# Patient Record
Sex: Male | Born: 1940
Health system: Southern US, Community
[De-identification: ages and names within clinical notes are randomized; demographics above are authoritative.]

## PROBLEM LIST (undated history)

## (undated) DIAGNOSIS — C61 Malignant neoplasm of prostate: Secondary | ICD-10-CM

## (undated) DIAGNOSIS — I10 Essential (primary) hypertension: Secondary | ICD-10-CM

## (undated) DIAGNOSIS — E119 Type 2 diabetes mellitus without complications: Secondary | ICD-10-CM

## (undated) DIAGNOSIS — M1990 Unspecified osteoarthritis, unspecified site: Secondary | ICD-10-CM

## (undated) DIAGNOSIS — K635 Polyp of colon: Secondary | ICD-10-CM

## (undated) HISTORY — DX: Essential (primary) hypertension: I10

## (undated) HISTORY — DX: Unspecified osteoarthritis, unspecified site: M19.90

## (undated) HISTORY — PX: TONSILLECTOMY: SUR1361

## (undated) HISTORY — DX: Polyp of colon: K63.5

## (undated) HISTORY — DX: Malignant neoplasm of prostate: C61

## (undated) HISTORY — DX: Type 2 diabetes mellitus without complications: E11.9

## (undated) HISTORY — PX: OTHER SURGICAL HISTORY: SHX169

---

## 2001-09-17 DIAGNOSIS — C61 Malignant neoplasm of prostate: Secondary | ICD-10-CM

## 2001-09-17 HISTORY — DX: Malignant neoplasm of prostate: C61

## 2004-01-18 ENCOUNTER — Ambulatory Visit: Admission: RE | Admit: 2004-01-18 | Discharge: 2004-04-17 | Payer: Self-pay | Admitting: Radiation Oncology

## 2004-01-31 ENCOUNTER — Encounter: Admission: RE | Admit: 2004-01-31 | Discharge: 2004-01-31 | Payer: Self-pay | Admitting: Urology

## 2004-03-06 ENCOUNTER — Ambulatory Visit (HOSPITAL_BASED_OUTPATIENT_CLINIC_OR_DEPARTMENT_OTHER): Admission: RE | Admit: 2004-03-06 | Discharge: 2004-03-06 | Payer: Self-pay | Admitting: Urology

## 2004-03-06 ENCOUNTER — Ambulatory Visit (HOSPITAL_COMMUNITY): Admission: RE | Admit: 2004-03-06 | Discharge: 2004-03-06 | Payer: Self-pay | Admitting: Urology

## 2005-06-19 IMAGING — CR DG CHEST 2V
2 series · 2 of 2 positions shown · non-contrast
Comparison: none

CLINICAL DATA: Preoperative respiratory exam.  V72.82.  Carcinoma of the prostate gland.
 PA AND LATERAL CHEST ? 01/31/2004
 The heart size and mediastinal contours are normal. The lungs are clear. The visualized skeleton is unremarkable.

 IMPRESSION
 No active disease.

[view not recorded (1 of 2)]
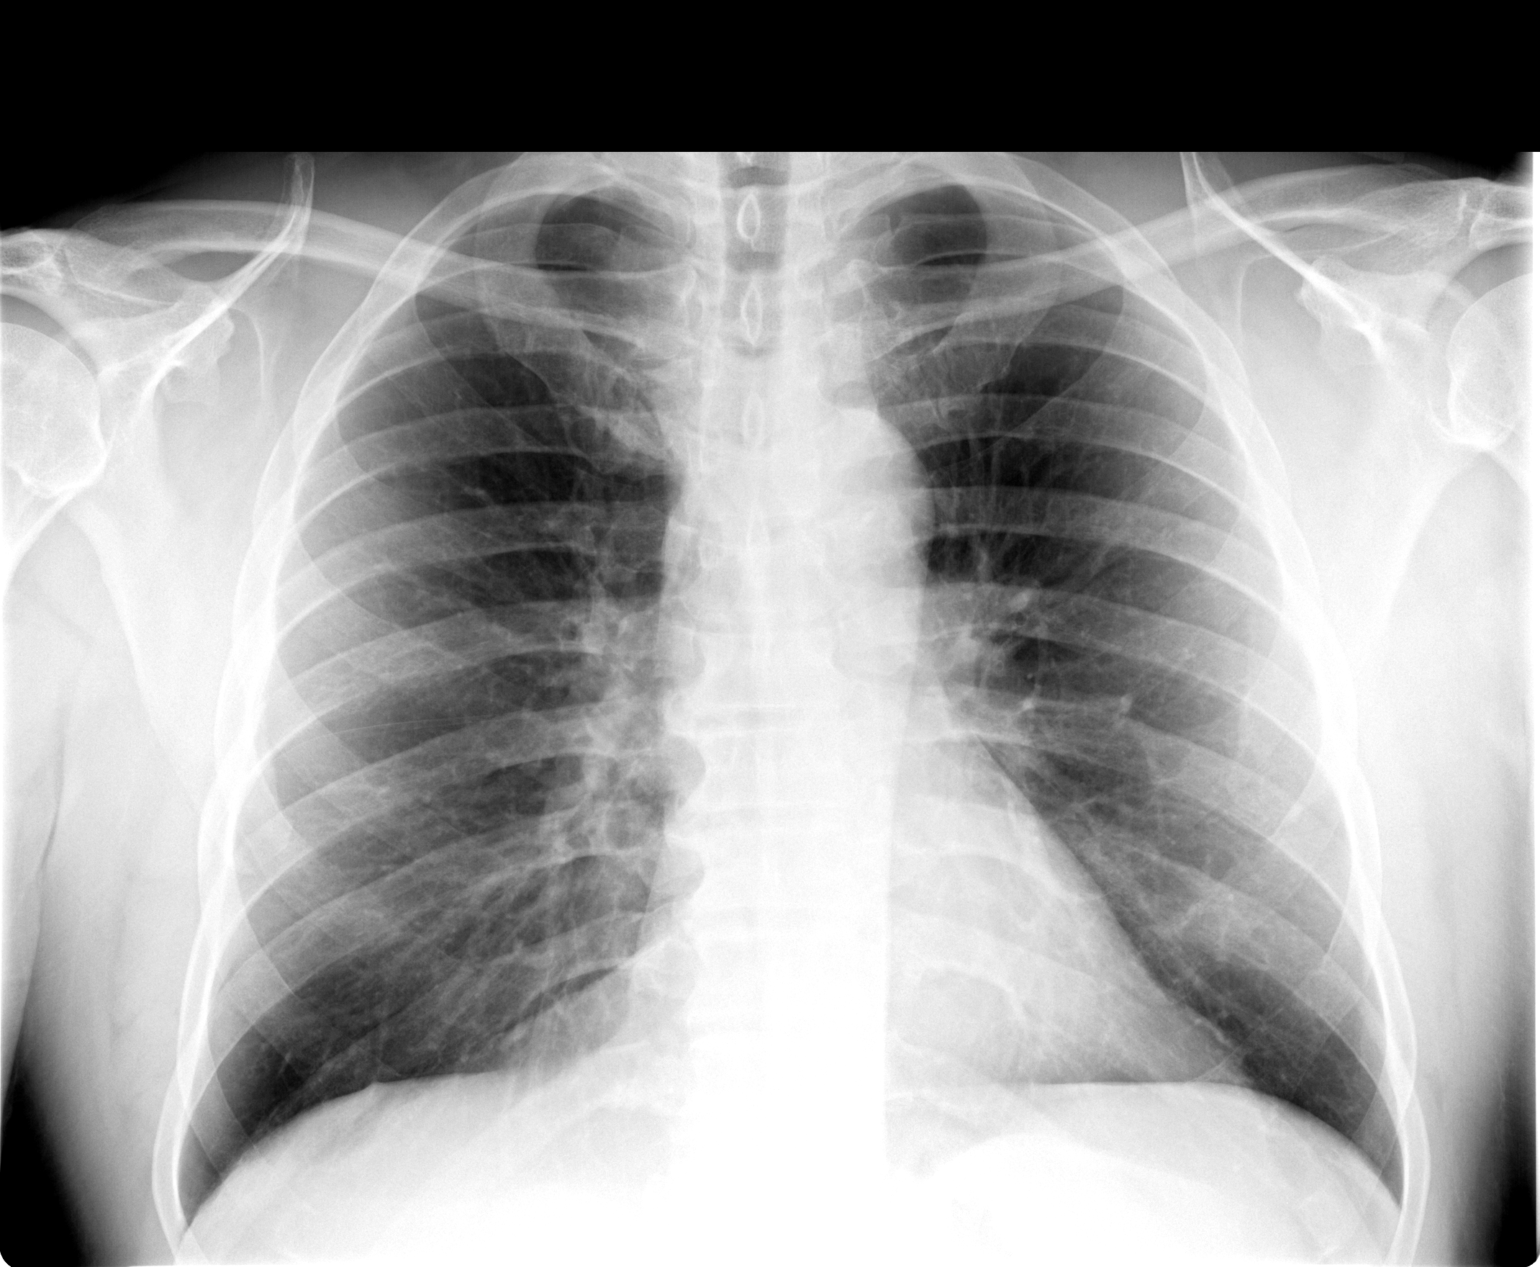

[view not recorded (2 of 2)]
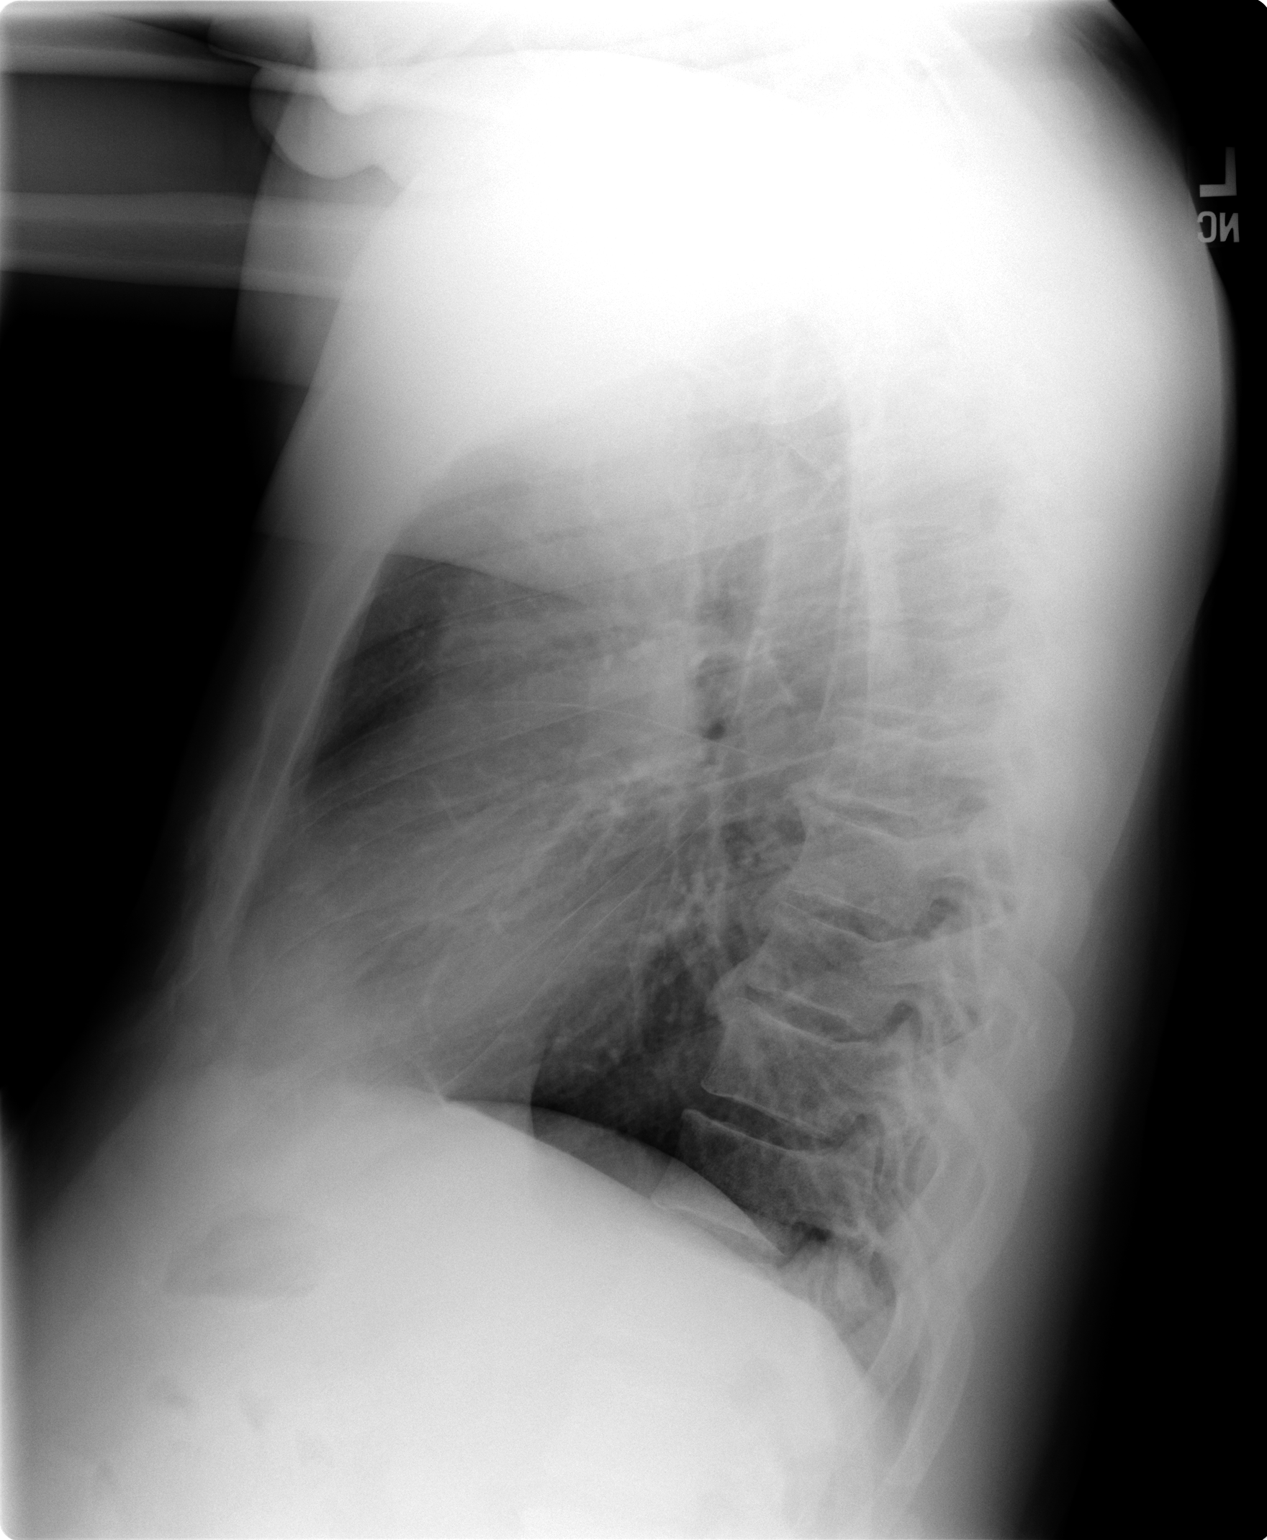

[2 of 2 positions shown; findings below may reference images not displayed]

## 2006-12-27 ENCOUNTER — Ambulatory Visit: Payer: Self-pay | Admitting: Gastroenterology

## 2007-01-10 ENCOUNTER — Ambulatory Visit: Payer: Self-pay | Admitting: Gastroenterology

## 2009-11-30 ENCOUNTER — Encounter (INDEPENDENT_AMBULATORY_CARE_PROVIDER_SITE_OTHER): Payer: Self-pay | Admitting: *Deleted

## 2010-09-20 ENCOUNTER — Telehealth: Payer: Self-pay | Admitting: Gastroenterology

## 2010-10-17 NOTE — Letter (Signed)
Summary: Colonoscopy Letter  [facility]  [address] [place], [place] [address]   Phone: [phone]  Fax: [phone]      November 30, 2009 MRN: [id number]   [facility] [address] [place], [place]  [address]   Dear Connor Cross,   According to your medical record, it is time for you to schedule a Colonoscopy. The American Cancer Society recommends this procedure as a method to detect early colon cancer. Patients with a family history of colon cancer, or a personal history of colon polyps or inflammatory bowel disease are at increased risk.  This letter has beeen generated based on the recommendations made at the time of your procedure. If you feel that in your particular situation this may no longer apply, please contact our office.  Please call our office at [phone] to schedule this appointment or to update your records at your earliest convenience.  Thank you for cooperating with [place] to provide you with the very best care possible.   Sincerely,  Rachael Fee, M.D.  [facility] Gastroenterology Division [phone]

## 2010-10-19 NOTE — Progress Notes (Signed)
Summary: Schedule Colonoscopy  Phone Note Outgoing Call Call back at [facility] Phone [phone]   Call placed by: Harlow Mares CMA Duncan Dull),  September 20, 2010 8:43 AM Call placed to: Patient Summary of Call: Left a message on the patient machine to call back and schedule a previsit and procedure with our office. needs to schedule follow up colonoscopy for adenomatous polyps.  Initial call taken by: Harlow Mares CMA Duncan Dull),  September 20, 2010 8:44 AM  Follow-up for Phone Call        Left a message on the patient machine to call back and schedule a previsit and procedure with our office. A letter will be mailed to the patient.   Follow-up by: Harlow Mares CMA Duncan Dull),  September 29, 2010 1:54 PM

## 2011-12-20 ENCOUNTER — Encounter: Payer: Self-pay | Admitting: Gastroenterology

## 2015-01-13 ENCOUNTER — Encounter: Payer: Self-pay | Admitting: Gastroenterology

## 2015-01-31 ENCOUNTER — Ambulatory Visit (AMBULATORY_SURGERY_CENTER): Payer: Self-pay | Admitting: *Deleted

## 2015-01-31 VITALS — Ht 73.0 in | Wt 215.2 lb

## 2015-01-31 DIAGNOSIS — Z86010 Personal history of colonic polyps: Secondary | ICD-10-CM

## 2015-01-31 NOTE — Progress Notes (Signed)
Denies allergies to eggs or soy products. Denies complications with sedation or anesthesia. Denies O2 use. Denies use of diet or weight loss medications.  Emmi instructions declined for colonoscopy.  

## 2015-02-01 ENCOUNTER — Encounter: Payer: Self-pay | Admitting: Gastroenterology

## 2015-02-16 ENCOUNTER — Encounter: Payer: Self-pay | Admitting: Gastroenterology

## 2015-02-21 ENCOUNTER — Ambulatory Visit (AMBULATORY_SURGERY_CENTER): Payer: Medicare Other | Admitting: Gastroenterology

## 2015-02-21 ENCOUNTER — Encounter: Payer: Self-pay | Admitting: Gastroenterology

## 2015-02-21 VITALS — BP 135/77 | HR 71 | Temp 96.7°F | Resp 17 | Ht 73.0 in | Wt 215.0 lb

## 2015-02-21 DIAGNOSIS — D124 Benign neoplasm of descending colon: Secondary | ICD-10-CM

## 2015-02-21 DIAGNOSIS — D122 Benign neoplasm of ascending colon: Secondary | ICD-10-CM

## 2015-02-21 DIAGNOSIS — Z86010 Personal history of colonic polyps: Secondary | ICD-10-CM

## 2015-02-21 MED ORDER — SODIUM CHLORIDE 0.9 % IV SOLN: 500.0000 mL | INTRAVENOUS | Status: DC

## 2015-02-21 NOTE — Progress Notes (Signed)
Report to PACU, RN, vss, BBS= Clear.  

## 2015-02-21 NOTE — Op Note (Signed)
[place]  [address] [place], [id number]   COLONOSCOPY PROCEDURE REPORT  PATIENT: Connor Cross, Connor Cross  MR#: [id number] BIRTHDATE: [birth year] , 69  yrs. old GENDER: male ENDOSCOPIST: Milus Banister, MD PROCEDURE DATE:  02/21/2015 PROCEDURE:   Colonoscopy, surveillance and Colonoscopy with snare polypectomy First Screening Colonoscopy - Avg.  risk and is 50 yrs.  old or older - No.  Prior Negative Screening - Now for repeat screening. N/A  History of Adenoma - Now for follow-up colonoscopy & has been > or = to 3 yrs.  Yes hx of adenoma.  Has been 3 or more years since last colonoscopy.  Polyps removed today? Yes ASA CLASS:   Class II INDICATIONS:Surveillance due to prior colonic neoplasia (colonoscopy 2008 Dr. Ardis Hughs, several small adenomas, recommended recall at 3 year interval). MEDICATIONS: Monitored anesthesia care and Propofol 230 mg IV  DESCRIPTION OF PROCEDURE:   After the risks benefits and alternatives of the procedure were thoroughly explained, informed consent was obtained.  The digital rectal exam revealed no abnormalities of the rectum.   The LB [id number]  endoscope was introduced through the anus and advanced to the cecum, which was identified by both the appendix and ileocecal valve. No adverse events experienced.   The quality of the prep was good.  The instrument was then slowly withdrawn as the colon was fully examined. Estimated blood loss is zero unless otherwise noted in this procedure report.   COLON FINDINGS: Five sessile polyps ranging between 3-29mm in size were found in the descending colon and ascending colon. Polypectomies were performed with a cold snare.  The resection was complete, the polyp tissue was completely retrieved and sent to histology.  Retroflexed views revealed no abnormalities. The time to cecum = 3.5 Withdrawal time = 10.8   The scope was withdrawn and the procedure completed. COMPLICATIONS: There were no  immediate complications.  ENDOSCOPIC IMPRESSION: Five sessile polyps ranging between 3-65mm in size were found in the descending colon and ascending colon; polypectomies were performed with a cold snare  RECOMMENDATIONS: If the polyp(s) removed today are proven to be adenomatous (pre-cancerous) polyps, you will need a colonoscopy in 3-5 years. You will receive a letter within 1-2 weeks with the results of your biopsy as well as final recommendations.  Please call my office if you have not received a letter after 3 weeks.  eSigned:  Milus Banister, MD 02/21/2015 11:13 AM

## 2015-02-21 NOTE — Progress Notes (Signed)
Called to room to assist during endoscopic procedure.  Patient ID and intended procedure confirmed with present staff. Received instructions for my participation in the procedure from the performing physician.  

## 2015-02-21 NOTE — Patient Instructions (Signed)

## 2015-02-22 ENCOUNTER — Telehealth: Payer: Self-pay | Admitting: Emergency Medicine

## 2015-02-22 NOTE — Telephone Encounter (Signed)
  Follow up Call-  Call back number 02/21/2015  Post procedure Call Back phone  # [phone]  Permission to leave phone message Yes     Patient questions:  Do you have a fever, pain , or abdominal swelling? No. Pain Score  0 *  Have you tolerated food without any problems? Yes.    Have you been able to return to your normal activities? Yes.    Do you have any questions about your discharge instructions: Diet   No. Medications  No. Follow up visit  No.  Do you have questions or concerns about your Care? No.  Actions: * If pain score is 4 or above: No action needed, pain <4.

## 2015-02-25 ENCOUNTER — Encounter: Payer: Self-pay | Admitting: Gastroenterology

## 2018-01-14 ENCOUNTER — Encounter: Payer: Self-pay | Admitting: Gastroenterology

## 2018-08-09 ENCOUNTER — Inpatient Hospital Stay (HOSPITAL_COMMUNITY): Payer: Medicare Other

## 2018-08-09 ENCOUNTER — Inpatient Hospital Stay (HOSPITAL_COMMUNITY)
Admission: EM | Admit: 2018-08-09 | Discharge: 2018-08-13 | DRG: 247 | Disposition: A | Discharge status: Home / Self Care | Payer: Medicare Other | Attending: Cardiology | Admitting: Cardiology

## 2018-08-09 ENCOUNTER — Other Ambulatory Visit: Payer: Self-pay

## 2018-08-09 ENCOUNTER — Encounter (HOSPITAL_COMMUNITY): Payer: Self-pay | Admitting: Emergency Medicine

## 2018-08-09 ENCOUNTER — Emergency Department (HOSPITAL_COMMUNITY): Payer: Medicare Other

## 2018-08-09 DIAGNOSIS — I249 Acute ischemic heart disease, unspecified: Secondary | ICD-10-CM

## 2018-08-09 DIAGNOSIS — I10 Essential (primary) hypertension: Secondary | ICD-10-CM | POA: Diagnosis present

## 2018-08-09 DIAGNOSIS — E1165 Type 2 diabetes mellitus with hyperglycemia: Secondary | ICD-10-CM | POA: Diagnosis present

## 2018-08-09 DIAGNOSIS — Z87891 Personal history of nicotine dependence: Secondary | ICD-10-CM | POA: Diagnosis not present

## 2018-08-09 DIAGNOSIS — Z79899 Other long term (current) drug therapy: Secondary | ICD-10-CM | POA: Diagnosis not present

## 2018-08-09 DIAGNOSIS — I214 Non-ST elevation (NSTEMI) myocardial infarction: Principal | ICD-10-CM | POA: Diagnosis present

## 2018-08-09 DIAGNOSIS — M1990 Unspecified osteoarthritis, unspecified site: Secondary | ICD-10-CM | POA: Diagnosis present

## 2018-08-09 DIAGNOSIS — Z7982 Long term (current) use of aspirin: Secondary | ICD-10-CM

## 2018-08-09 DIAGNOSIS — E785 Hyperlipidemia, unspecified: Secondary | ICD-10-CM | POA: Diagnosis present

## 2018-08-09 DIAGNOSIS — E119 Type 2 diabetes mellitus without complications: Secondary | ICD-10-CM

## 2018-08-09 DIAGNOSIS — I350 Nonrheumatic aortic (valve) stenosis: Secondary | ICD-10-CM

## 2018-08-09 DIAGNOSIS — I2510 Atherosclerotic heart disease of native coronary artery without angina pectoris: Secondary | ICD-10-CM | POA: Diagnosis present

## 2018-08-09 DIAGNOSIS — Z7984 Long term (current) use of oral hypoglycemic drugs: Secondary | ICD-10-CM

## 2018-08-09 DIAGNOSIS — Z9114 Patient's other noncompliance with medication regimen: Secondary | ICD-10-CM

## 2018-08-09 DIAGNOSIS — I472 Ventricular tachycardia: Secondary | ICD-10-CM | POA: Diagnosis not present

## 2018-08-09 DIAGNOSIS — Z955 Presence of coronary angioplasty implant and graft: Secondary | ICD-10-CM

## 2018-08-09 DIAGNOSIS — Z8546 Personal history of malignant neoplasm of prostate: Secondary | ICD-10-CM

## 2018-08-09 LAB — BASIC METABOLIC PANEL
Anion gap: 9 (ref 5–15)
BUN: 8 mg/dL (ref 8–23)
CHLORIDE: 99 mmol/L (ref 98–111)
CO2: 23 mmol/L (ref 22–32)
CREATININE: 0.9 mg/dL (ref 0.61–1.24)
Calcium: 9.2 mg/dL (ref 8.9–10.3)
GFR calc non Af Amer: >60 mL/min (ref >60)
Glucose, Bld: 291 mg/dL — ABNORMAL HIGH (ref 70–99)
Potassium: 3.7 mmol/L (ref 3.5–5.1)
SODIUM: 131 mmol/L — AB (ref 135–145)

## 2018-08-09 LAB — TSH: TSH: 1.299 u[IU]/mL (ref 0.350–4.500)

## 2018-08-09 LAB — T4, FREE: Free T4: 0.78 ng/dL — ABNORMAL LOW (ref 0.82–1.77)

## 2018-08-09 LAB — I-STAT TROPONIN, ED: Troponin i, poc: 0.4 ng/mL (ref 0.00–0.08)

## 2018-08-09 LAB — CBC
HCT: 41.1 % (ref 39.0–52.0)
Hemoglobin: 13.8 g/dL (ref 13.0–17.0)
MCH: 30.3 pg (ref 26.0–34.0)
MCHC: 33.6 g/dL (ref 30.0–36.0)
MCV: 90.3 fL (ref 80.0–100.0)
Platelets: 218 10*3/uL (ref 150–400)
RBC: 4.55 MIL/uL (ref 4.22–5.81)
RDW: 11.9 % (ref 11.5–15.5)
WBC: 7.5 10*3/uL (ref 4.0–10.5)
nRBC: 0 % (ref 0.0–0.2)

## 2018-08-09 LAB — BRAIN NATRIURETIC PEPTIDE: B NATRIURETIC PEPTIDE 5: 28.5 pg/mL (ref 0.0–100.0)

## 2018-08-09 LAB — GLUCOSE, CAPILLARY
GLUCOSE-CAPILLARY: 231 mg/dL — AB (ref 70–99)
Glucose-Capillary: 290 mg/dL — ABNORMAL HIGH (ref 70–99)

## 2018-08-09 LAB — TROPONIN I
TROPONIN I: 5.27 ng/mL — AB (ref <0.03)
TROPONIN I: 30.64 ng/mL — AB (ref <0.03)
Troponin I: 0.89 ng/mL (ref <0.03)

## 2018-08-09 LAB — HEPARIN LEVEL (UNFRACTIONATED): Heparin Unfractionated: 0.36 IU/mL (ref 0.30–0.70)

## 2018-08-09 LAB — HEMOGLOBIN A1C
Hgb A1c MFr Bld: 11.3 % — ABNORMAL HIGH (ref 4.8–5.6)
Mean Plasma Glucose: 277.61 mg/dL

## 2018-08-09 LAB — ECHOCARDIOGRAM COMPLETE
Height: 73 in
Weight: 3120 oz

## 2018-08-09 MED ORDER — ONDANSETRON HCL 4 MG/2ML IJ SOLN: 4.0000 mg | Freq: Four times a day (QID) | INTRAMUSCULAR | Status: DC | PRN

## 2018-08-09 MED ORDER — HEPARIN BOLUS VIA INFUSION
4000.0000 [IU] | Freq: Once | INTRAVENOUS | Status: AC
  Administered 2018-08-09: 4000 [IU] via INTRAVENOUS
  Filled 2018-08-09: qty 4000

## 2018-08-09 MED ORDER — LISINOPRIL 10 MG PO TABS
10.0000 mg | ORAL_TABLET | Freq: Every day | ORAL | Status: DC
  Administered 2018-08-10 – 2018-08-13 (×4): 10 mg via ORAL
  Filled 2018-08-09 (×4): qty 1

## 2018-08-09 MED ORDER — HEPARIN (PORCINE) 25000 UT/250ML-% IV SOLN
1200.0000 [IU]/h | INTRAVENOUS | Status: DC
  Administered 2018-08-09: 1050 [IU]/h via INTRAVENOUS
  Filled 2018-08-09 (×3): qty 250

## 2018-08-09 MED ORDER — ATORVASTATIN CALCIUM 80 MG PO TABS
80.0000 mg | ORAL_TABLET | Freq: Every day | ORAL | Status: DC
  Administered 2018-08-09 – 2018-08-12 (×4): 80 mg via ORAL
  Filled 2018-08-09 (×5): qty 1

## 2018-08-09 MED ORDER — INSULIN ASPART 100 UNIT/ML ~~LOC~~ SOLN
0.0000 [IU] | Freq: Three times a day (TID) | SUBCUTANEOUS | Status: DC
  Administered 2018-08-09: 8 [IU] via SUBCUTANEOUS
  Administered 2018-08-10: 5 [IU] via SUBCUTANEOUS
  Administered 2018-08-10: 3 [IU] via SUBCUTANEOUS
  Administered 2018-08-10 – 2018-08-11 (×2): 5 [IU] via SUBCUTANEOUS
  Administered 2018-08-12: 2 [IU] via SUBCUTANEOUS
  Administered 2018-08-12: 18:00:00 3 [IU] via SUBCUTANEOUS

## 2018-08-09 MED ORDER — LISINOPRIL 10 MG PO TABS
5.0000 mg | ORAL_TABLET | Freq: Every day | ORAL | Status: DC
  Filled 2018-08-09: qty 1

## 2018-08-09 MED ORDER — ACETAMINOPHEN 325 MG PO TABS: 650.0000 mg | ORAL_TABLET | ORAL | Status: DC | PRN

## 2018-08-09 MED ORDER — LISINOPRIL 10 MG PO TABS
5.0000 mg | ORAL_TABLET | Freq: Every day | ORAL | Status: DC
  Administered 2018-08-09: 5 mg via ORAL
  Filled 2018-08-09: qty 1

## 2018-08-09 MED ORDER — NITROGLYCERIN 0.4 MG SL SUBL: 0.4000 mg | SUBLINGUAL_TABLET | SUBLINGUAL | Status: DC | PRN

## 2018-08-09 MED ORDER — ASPIRIN 325 MG PO TABS
325.0000 mg | ORAL_TABLET | Freq: Every day | ORAL | Status: DC
  Administered 2018-08-09 – 2018-08-11 (×3): 325 mg via ORAL
  Filled 2018-08-09 (×3): qty 1

## 2018-08-09 MED ORDER — CARVEDILOL 3.125 MG PO TABS
6.2500 mg | ORAL_TABLET | Freq: Two times a day (BID) | ORAL | Status: DC
  Administered 2018-08-09 – 2018-08-13 (×8): 6.25 mg via ORAL
  Filled 2018-08-09 (×4): qty 1
  Filled 2018-08-09: qty 2
  Filled 2018-08-09 (×3): qty 1
  Filled 2018-08-09: qty 2
  Filled 2018-08-09: qty 1

## 2018-08-09 MED ORDER — NITROGLYCERIN IN D5W 200-5 MCG/ML-% IV SOLN
0.0000 ug/min | INTRAVENOUS | Status: DC
  Administered 2018-08-09: 5 ug/min via INTRAVENOUS
  Administered 2018-08-10 – 2018-08-11 (×3): 30 ug/min via INTRAVENOUS
  Filled 2018-08-09 (×3): qty 250

## 2018-08-09 MED ORDER — ASPIRIN 81 MG PO TABS: 81.0000 mg | ORAL_TABLET | Freq: Every day | ORAL | Status: DC

## 2018-08-09 NOTE — ED Provider Notes (Signed)
[facility] EMERGENCY DEPARTMENT Provider Note   CSN: [id number] Arrival date & time: 08/09/18  0308     History   Chief Complaint Chief Complaint  Patient presents with  . Chest Pain    HPI BOYCE KELTNER is a 77 y.o. male.  HPI  This is a 77 year old male with a history of diabetes and hypertension who presents with chest pain.  Patient reports that he had onset of pressure-like chest pain over the left side of his chest yesterday while golfing.  It did radiate into his left arm.  Pain subsided when he went home and "rested."  He had recurrence of symptoms this morning at 1 AM.  Currently his pain is 4 out of 10.  It remains pressure-like.  It does not radiate to his back.  He denies any nausea, shortness of breath, sweating.  No known history of coronary artery disease.  However, wife notes that he has been noncompliant with his diabetes medications for the last month.  Denies any recent fevers or cough.  Past Medical History:  Diagnosis Date  . Arthritis   . Diabetes mellitus ([place])   . Prostate cancer ([place]) 2003    There are no active problems to display for this patient.   Past Surgical History:  Procedure Laterality Date  . seed implants    . TONSILLECTOMY  age 89        Home Medications    Prior to Admission medications   Medication Sig Start Date End Date Taking? Authorizing Provider  aspirin 81 MG tablet Take 81 mg by mouth daily.    [provider]  glimepiride (AMARYL) 4 MG tablet Take 4 mg by mouth daily with breakfast.    [provider]  metFORMIN (GLUCOPHAGE) 1000 MG tablet Take 1,000 mg by mouth 2 (two) times daily with a meal.    [provider]  Multiple Vitamins-Minerals (MULTIVITAMIN ADULT PO) Take by mouth.    [provider]    Family History Family History  Problem Relation Age of Onset  . Colon cancer Neg Hx     Social History Social History   Tobacco Use  . Smoking status: Former  Smoker    Last attempt to quit: 09/17/2004    Years since quitting: 13.9  . Smokeless tobacco: Never Used  Substance Use Topics  . Alcohol use: Yes    Alcohol/week: 3.0 standard drinks    Types: 3 Shots of liquor per week    Comment: weekends  . Drug use: No     Allergies   Patient has no known allergies.   Review of Systems Review of Systems  Constitutional: Negative for fever.  Respiratory: Positive for chest tightness. Negative for cough and shortness of breath.   Cardiovascular: Negative for leg swelling.  Gastrointestinal: Negative for abdominal pain, nausea and vomiting.  Genitourinary: Negative for dysuria.  Musculoskeletal: Negative for back pain.  All other systems reviewed and are negative.    Physical Exam Updated Vital Signs BP (!) 155/123   Pulse 78   Temp 98.7 F (37.1 C) (Oral)   Resp 19   Ht 1.854 m (6\' 1" )   Wt 88.5 kg   SpO2 100%   BMI 25.73 kg/m   Physical Exam  Constitutional: He is oriented to person, place, and time. He appears well-developed and well-nourished.  HENT:  Head: Normocephalic and atraumatic.  Cardiovascular: Normal rate, regular rhythm, normal heart sounds and normal pulses.  No murmur heard. Pulmonary/Chest:  Effort normal and breath sounds normal. No respiratory distress. He has no wheezes.  Abdominal: Soft. Bowel sounds are normal. There is no tenderness. There is no rebound.  Musculoskeletal: He exhibits no edema.       Right lower leg: He exhibits no edema.       Left lower leg: He exhibits no edema.  Neurological: He is alert and oriented to person, place, and time.  Skin: Skin is warm and dry.  Psychiatric: He has a normal mood and affect.  Nursing note and vitals reviewed.    ED Treatments / Results  Labs (all labs ordered are listed, but only abnormal results are displayed) Labs Reviewed  BASIC METABOLIC PANEL - Abnormal; Notable for the following components:      Result Value   Sodium 131 (*)    Glucose, Bld  291 (*)    All other components within normal limits  I-STAT TROPONIN, ED - Abnormal; Notable for the following components:   Troponin i, poc 0.40 (*)    All other components within normal limits  CBC  HEPARIN LEVEL (UNFRACTIONATED)    EKG EKG Interpretation  Date/Time:  Saturday August 09 2018 03:17:20 EST Ventricular Rate:  82 PR Interval:    QRS Duration: 111 QT Interval:  386 QTC Calculation: 451 R Axis:   -48 Text Interpretation:  Sinus rhythm Probable left atrial enlargement Incomplete left bundle branch block Anterior Q waves, possibly due to ILBBB Similar to prior Confirmed by Thayer Jew [id number]) on 08/09/2018 3:47:03 AM   Radiology No results found.  Procedures Procedures (including critical care time)  CRITICAL CARE Performed by: Merryl Hacker   Total critical care time: 35 minutes  Critical care time was exclusive of separately billable procedures and treating other patients.  Critical care was necessary to treat or prevent imminent or life-threatening deterioration.  Critical care was time spent personally by me on the following activities: development of treatment plan with patient and/or surrogate as well as nursing, discussions with consultants, evaluation of patient's response to treatment, examination of patient, obtaining history from patient or surrogate, ordering and performing treatments and interventions, ordering and review of laboratory studies, ordering and review of radiographic studies, pulse oximetry and re-evaluation of patient's condition.   Medications Ordered in ED Medications  nitroGLYCERIN 50 mg in dextrose 5 % 250 mL (0.2 mg/mL) infusion (has no administration in time range)  heparin bolus via infusion 4,000 Units (has no administration in time range)  heparin ADULT infusion 100 units/mL (25000 units/243mL sodium chloride 0.45%) (has no administration in time range)     Initial Impression / Assessment and Plan / ED Course    I have reviewed the triage vital signs and the nursing notes.  Pertinent labs & imaging results that were available during my care of the patient were reviewed by me and considered in my medical decision making (see chart for details).     Patient presents with chest pain.  Initially while golfing yesterday but recurred this morning.  He is nontoxic-appearing and vital signs are notable for elevated blood pressure at 180/96.  EKG shows no significant ST elevation.  He does have minimal ST elevation limited to the V2.  Otherwise when compared to prior it is unchanged.  Chest x-ray shows no evidence of pneumothorax or pneumonia.  Initial troponin is 0.4.  Given history and risk factors, suspect that this is a true NSTEMI.  Patient was placed on IV nitroglycerin and heparin.  Cardiology was consulted for  admission.  I updated the patient and his wife.  Would anticipate patient would likely get a cardiac catheterization later today.  Final Clinical Impressions(s) / ED Diagnoses   Final diagnoses:  NSTEMI (non-ST elevated myocardial infarction) [facility])    ED Discharge Orders    None       Natividad Schlosser, Barbette Hair, MD 08/09/18 [phone]

## 2018-08-09 NOTE — Progress Notes (Signed)
Pt given Lipitor and Coreg this evening, pt left sitting on tray, did not take them before dietary removed dinner tray from room. Pt stated he did not take med. On coming nurse aware.  Amanda Cockayne, RN

## 2018-08-09 NOTE — ED Notes (Signed)
Patient transported to X-ray 

## 2018-08-09 NOTE — ED Notes (Signed)
On-call cards paged concerning troponin

## 2018-08-09 NOTE — Progress Notes (Signed)
Pt transferred to 4E-04 from ED via stretcher with staff and family. Pt moved to bed. Pt given CHG bath. TELE applied, CCMD notified. Pt oriented to room and call bell. Call bell within reach. Will continue to monitor.  Amanda Cockayne, RN

## 2018-08-09 NOTE — ED Notes (Signed)
Dr Dina Rich informed of troponin results .[place]

## 2018-08-09 NOTE — Progress Notes (Signed)
  Echocardiogram 2D Echocardiogram has been performed.  Connor Cross F 08/09/2018, 11:33 AM

## 2018-08-09 NOTE — ED Triage Notes (Signed)
C/O of intermitted left sided chest tightness that started yesterday. EMS called to house and gave 2 81mg  aspirin. Pt came POV to hospital. Pain 4/10 on arrival.

## 2018-08-09 NOTE — H&P (Signed)
Cardiology Consult    Patient ID: ZAYVIEN CANNING MRN: [id number], DOB/AGE: 78-Sep-1942   Admit date: 08/09/2018 Date of Consult: 08/09/2018  Primary Physician: Nolene Ebbs, MD Primary Cardiologist: No primary care provider on file.  Patient Profile    Connor Cross is a 77 y.o. male who presents with an NSTEMI  Past Medical History   Past Medical History:  Diagnosis Date  . Arthritis   . Diabetes mellitus ([place])   . Prostate cancer ([place]) 2003    Past Surgical History:  Procedure Laterality Date  . seed implants    . TONSILLECTOMY  age 64     Allergies  No Known Allergies  History of Present Illness    77 year old man with past medical history of hypertension and diabetes.  Both untreated.  He is on prescribed medications but but given his health believe that it could hurt his manhood he has not been taking blood pressure medications.  Also not interested in checking his blood pressure or blood sugars at home does not take his blood sugar medications. Today he presents following 2 episodes of chest pain at first 1 started on a golf course following his round of games.  Pain was sharp in the left chest nonradiating.  Pain lasted for minutes it subsided mostly and then recurred few hours later.  Upon recurrence of the chest pain he had worried and presented to the emergency room.  His any blood pressure was 536 systolic.  Sugar 200s.  Chest pain was 6 out of 10.  Medical Center was initiated his pain is now 4 out of 10.  He took 2 baby aspirins at home.  Heparin was initiated  Inpatient Medications    . aspirin  325 mg Oral Daily  . lisinopril  5 mg Oral Daily    Family History    Family History  Problem Relation Age of Onset  . Colon cancer Neg Hx    has no family status information on file.    Social History    Social History   Socioeconomic History  . Marital status: Single    Spouse name: Not on file  . Number of children: Not on file  . Years of  education: Not on file  . Highest education level: Not on file  Occupational History  . Not on file  Social Needs  . Financial resource strain: Not on file  . Food insecurity:    Worry: Not on file    Inability: Not on file  . Transportation needs:    Medical: Not on file    Non-medical: Not on file  Tobacco Use  . Smoking status: Former Smoker    Last attempt to quit: 09/17/2004    Years since quitting: 13.9  . Smokeless tobacco: Never Used  Substance and Sexual Activity  . Alcohol use: Yes    Alcohol/week: 3.0 standard drinks    Types: 3 Shots of liquor per week    Comment: weekends  . Drug use: No  . Sexual activity: Not on file  Lifestyle  . Physical activity:    Days per week: Not on file    Minutes per session: Not on file  . Stress: Not on file  Relationships  . Social connections:    Talks on phone: Not on file    Gets together: Not on file    Attends religious service: Not on file    Active member of club or organization: Not on file  Attends meetings of clubs or organizations: Not on file    Relationship status: Not on file  . Intimate partner violence:    Fear of current or ex partner: Not on file    Emotionally abused: Not on file    Physically abused: Not on file    Forced sexual activity: Not on file  Other Topics Concern  . Not on file  Social History Narrative  . Not on file     Review of Systems    General:  No chills, fever, night sweats or weight changes.  Cardiovascular:  No chest pain, dyspnea on exertion, edema, orthopnea, palpitations, paroxysmal nocturnal dyspnea. Dermatological: No rash, lesions/masses Respiratory: No cough, dyspnea Urologic: No hematuria, dysuria Abdominal:   No nausea, vomiting, diarrhea, bright red blood per rectum, melena, or hematemesis Neurologic:  No visual changes, wkns, changes in mental status. All other systems reviewed and are otherwise negative except as noted above.  Physical Exam    Blood pressure  (!) 155/123, pulse 78, temperature 98.7 F (37.1 C), temperature source Oral, resp. rate 19, height 6\' 1"  (1.854 m), weight 88.5 kg, SpO2 100 %.  General: Pleasant, NAD Psych: Normal affect. Neuro: Alert and oriented X 3. Moves all extremities spontaneously. HEENT: Normal  Neck: Supple without bruits or JVD. Lungs:  Resp regular and unlabored, CTA. Heart: RRR no s3, s4, or murmurs. Abdomen: Soft, non-tender, non-distended, BS + x 4.  Extremities: No clubbing, cyanosis or edema. DP/PT/Radials 2+ and equal bilaterally.  Labs    Troponin [facility] of Care Test) Recent Labs    08/09/18 0332  TROPIPOC 0.40*   No results for input(s): CKTOTAL, CKMB, TROPONINI in the last 72 hours. Lab Results  Component Value Date   WBC 7.5 08/09/2018   HGB 13.8 08/09/2018   HCT 41.1 08/09/2018   MCV 90.3 08/09/2018   PLT 218 08/09/2018    Recent Labs  Lab 08/09/18 0326  NA 131*  K 3.7  CL 99  CO2 23  BUN 8  CREATININE 0.90  CALCIUM 9.2  GLUCOSE 291*   No results found for: CHOL, HDL, LDLCALC, TRIG No results found for: [facility]   Radiology Studies    Dg Chest 2 View  Result Date: 08/09/2018 CLINICAL DATA:  Left-sided chest pain EXAM: CHEST - 2 VIEW COMPARISON:  01/31/2004 FINDINGS: The heart size and mediastinal contours are within normal limits. Both lungs are clear. The visualized skeletal structures are unremarkable. IMPRESSION: No active cardiopulmonary disease. Electronically Signed   By: Ulyses Jarred M.D.   On: 08/09/2018 04:29    ECG & Cardiac Imaging    Normal sinus rhythm, no evidence of acute ischemia- personally reviewed.  Assessment & Plan    NSTEMI: Diabetic and hypertensive patient with no known coronary artery disease.  Noncompliant with medications at home.  Currently in pain.  We will try to control medically to proceed with PCI in the next 48 hours. -Nitro glycerin drip.  Will uptitrate as needed for pain control -Started on a statin, baby aspirin, given full dose  aspirin, carvedilol, lisinopril -Blood pressure target for the next 24 hours is probably around 150 given the high presenting blood pressure -Echo   Signed, Cristina Gong, MD 08/09/2018, 4:55 AM  For questions or updates, please contact   Please consult [email or web address] for contact info under Cardiology/STEMI.

## 2018-08-09 NOTE — Progress Notes (Signed)
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 Normal EF with mild inferior hypokinesis. Continue aspirin, heparin, nitroglycerin drip, BB, ACEi. Plan on cath 11/25. Will load with effient then.  Care taken over from [facility] heart care. My contact information below.   Nigel Mormon, MD [facility] Pager: [phone] Office: [phone] If no answer Cell [phone]

## 2018-08-09 NOTE — ED Notes (Signed)
Nurse starting IV and drawing labs. 

## 2018-08-09 NOTE — Progress Notes (Signed)
ANTICOAGULATION CONSULT NOTE - Initial Consult  Pharmacy Consult for Heparin Indication: chest pain/ACS  No Known Allergies  Patient Measurements: Height: 6\' 1"  (185.4 cm) Weight: 195 lb (88.5 kg) IBW/kg (Calculated) : 79.9  Vital Signs: Temp: 98.7 F (37.1 C) (11/23 0317) Temp Source: Oral (11/23 0317) BP: 186/101 (11/23 0400) Pulse Rate: 74 (11/23 0400)  Labs: Recent Labs    08/09/18 0326  HGB 13.8  HCT 41.1  PLT 218  CREATININE 0.90    Estimated Creatinine Clearance: 76.4 mL/min (by C-G formula based on SCr of 0.9 mg/dL).   Medical History: Past Medical History:  Diagnosis Date  . Arthritis   . Diabetes mellitus ([place])   . Prostate cancer ([place]) 2003    Medications:  No current facility-administered medications on file prior to encounter.    Current Outpatient Medications on File Prior to Encounter  Medication Sig Dispense Refill  . aspirin 81 MG tablet Take 81 mg by mouth daily.    [place] Kitchen glimepiride (AMARYL) 4 MG tablet Take 4 mg by mouth daily with breakfast.    . metFORMIN (GLUCOPHAGE) 1000 MG tablet Take 1,000 mg by mouth 2 (two) times daily with a meal.    . Multiple Vitamins-Minerals (MULTIVITAMIN ADULT PO) Take by mouth.       Assessment: 77 y.o. male with chest pain for heparin  Goal of Therapy:  Heparin level 0.3-0.7 units/ml Monitor platelets by anticoagulation protocol: Yes   Plan:  Heparin 4000 units IV bolus, then start heparin 1050 units/hr Check heparin level in 6 hours.   Caryl Pina 08/09/2018,4:15 AM

## 2018-08-09 NOTE — Progress Notes (Signed)
ANTICOAGULATION CONSULT NOTE - Initial Consult  Pharmacy Consult for Heparin Indication: chest pain/ACS  No Known Allergies  Patient Measurements: Height: 6\' 1"  (185.4 cm) Weight: 195 lb (88.5 kg) IBW/kg (Calculated) : 79.9  Vital Signs: Temp: 98.5 F (36.9 C) (11/23 1308) Temp Source: Oral (11/23 1308) BP: 129/80 (11/23 1308) Pulse Rate: 80 (11/23 1308)  Labs: Recent Labs    08/09/18 0326 08/09/18 0624 08/09/18 1231  HGB 13.8  --   --   HCT 41.1  --   --   PLT 218  --   --   HEPARINUNFRC  --   --  0.36  CREATININE 0.90  --   --   TROPONINI  --  0.89*  --     Estimated Creatinine Clearance: 76.4 mL/min (by C-G formula based on SCr of 0.9 mg/dL).   Medical History: Past Medical History:  Diagnosis Date  . Arthritis   . Diabetes mellitus ([place])   . Prostate cancer ([place]) 2003    Medications:  No current facility-administered medications on file prior to encounter.    Current Outpatient Medications on File Prior to Encounter  Medication Sig Dispense Refill  . aspirin 81 MG tablet Take 81 mg by mouth once.     . [DISCONTINUED] glimepiride (AMARYL) 4 MG tablet Take 4 mg by mouth daily with breakfast.    . [DISCONTINUED] metFORMIN (GLUCOPHAGE) 1000 MG tablet Take 1,000 mg by mouth 2 (two) times daily with a meal.    . [DISCONTINUED] Multiple Vitamins-Minerals (MULTIVITAMIN ADULT PO) Take by mouth.       Assessment: 77 y.o. male with chest pain for heparin.  No AC PTA, CBC wnl, and no bleeding reported.  Initial heparin level therapeutic at 0.36 on 1050 units/hr.  Goal of Therapy:  Heparin level 0.3-0.7 units/ml Monitor platelets by anticoagulation protocol: Yes   Plan:  Increase heparin gtt to 1100 units/hr F/u AM heparin level Monitor CBC, s/s bleeding   Bertis Ruddy, PharmD Clinical Pharmacist Please check AMION for all [place] numbers 08/09/2018 1:17 PM

## 2018-08-09 NOTE — Progress Notes (Signed)
Patient ID: Connor Cross, male   DOB: 1941-07-09, 77 y.o.   MRN: [id number]   [facility] Cardiology Attending  On my arrival the patient and his wife mentioned that he had been seen by a [facility] cardiologist but when I found no records I suspected that he was a patient of Dr. Salvadore Cross. He is and we will pass patient off to Dr. Lorre Cross.   Connor Cross.

## 2018-08-10 LAB — GLUCOSE, CAPILLARY
GLUCOSE-CAPILLARY: 170 mg/dL — AB (ref 70–99)
Glucose-Capillary: 220 mg/dL — ABNORMAL HIGH (ref 70–99)
Glucose-Capillary: 225 mg/dL — ABNORMAL HIGH (ref 70–99)
Glucose-Capillary: 147 mg/dL — ABNORMAL HIGH (ref 70–99)

## 2018-08-10 LAB — HEPARIN LEVEL (UNFRACTIONATED): Heparin Unfractionated: 0.41 IU/mL (ref 0.30–0.70)

## 2018-08-10 LAB — TROPONIN I: TROPONIN I: 33.43 ng/mL — AB (ref <0.03)

## 2018-08-10 LAB — CBC
HEMATOCRIT: 37 % — AB (ref 39.0–52.0)
Hemoglobin: 12.2 g/dL — ABNORMAL LOW (ref 13.0–17.0)
MCH: 29.4 pg (ref 26.0–34.0)
MCHC: 33 g/dL (ref 30.0–36.0)
MCV: 89.2 fL (ref 80.0–100.0)
Platelets: 212 10*3/uL (ref 150–400)
RBC: 4.15 MIL/uL — ABNORMAL LOW (ref 4.22–5.81)
RDW: 11.9 % (ref 11.5–15.5)
WBC: 12.9 10*3/uL — AB (ref 4.0–10.5)
nRBC: 0 % (ref 0.0–0.2)

## 2018-08-10 MED ORDER — SODIUM CHLORIDE 0.9% FLUSH: 3.0000 mL | Freq: Two times a day (BID) | INTRAVENOUS | Status: DC

## 2018-08-10 MED ORDER — SODIUM CHLORIDE 0.9 % IV SOLN
INTRAVENOUS | Status: AC
  Administered 2018-08-10: 23:00:00 via INTRAVENOUS

## 2018-08-10 MED ORDER — ASPIRIN 81 MG PO CHEW
81.0000 mg | CHEWABLE_TABLET | ORAL | Status: AC
  Administered 2018-08-11: 81 mg via ORAL
  Filled 2018-08-10: qty 1

## 2018-08-10 MED ORDER — SODIUM CHLORIDE 0.9 % IV SOLN: 250.0000 mL | INTRAVENOUS | Status: DC | PRN

## 2018-08-10 MED ORDER — SODIUM CHLORIDE 0.9% FLUSH: 3.0000 mL | INTRAVENOUS | Status: DC | PRN

## 2018-08-10 NOTE — Progress Notes (Signed)
Subjective:  Patient two episodes of chest tightness 11/23 evening requiring 2 SL NTG and increase in IV NTG requirement.  Telemetry shows episodes of 5-6 beat NSVT.  Trop increased to 30 ng/mL.  Currently denies any chest pain/tightness  Objective:  Vital Signs in the last 24 hours: Temp:  [98 F (36.7 C)-98.5 F (36.9 C)] 98 F (36.7 C) (11/24 0739) Pulse Rate:  [69-89] 81 (11/24 0739) Resp:  [13-26] 24 (11/24 0739) BP: (103-162)/(60-100) 105/60 (11/24 0739) SpO2:  [94 %-99 %] 95 % (11/24 0739) Weight:  [88.5 kg] 88.5 kg (11/23 1308)  Intake/Output from previous day: 11/23 0701 - 11/24 0700 In: 669.4 [P.O.:480; I.V.:189.4] Out: 550 [Urine:550] Intake/Output from this shift: No intake/output data recorded.  Physical Exam: Physical Exam  Constitutional: He is oriented to person, place, and time. He appears well-developed and well-nourished. No distress.  HENT:  Head: Normocephalic and atraumatic.  Eyes: Pupils are equal, round, and reactive to light. Conjunctivae are normal.  Neck: Normal range of motion. Neck supple. No JVD present.  Cardiovascular: Normal rate, regular rhythm and intact distal pulses.  Murmur heard.  Crescendo systolic (Aortic area) murmur is present with a grade of 2/6. Pulmonary/Chest: Effort normal. He has no wheezes. He has rales.  Abdominal: Soft. Bowel sounds are normal. There is no tenderness. There is no rebound.  Musculoskeletal: Normal range of motion. He exhibits edema.  Lymphadenopathy:    He has no cervical adenopathy.  Neurological: He is alert and oriented to person, place, and time. No cranial nerve deficit.  Skin: Skin is warm and dry.  Psychiatric: He has a normal mood and affect.  Nursing note and vitals reviewed.    Lab Results: Recent Labs    08/09/18 0326 08/10/18 0231  WBC 7.5 12.9*  HGB 13.8 12.2*  PLT 218 212   Recent Labs    08/09/18 0326  NA 131*  K 3.7  CL 99  CO2 23  GLUCOSE 291*  BUN 8  CREATININE  0.90   Recent Labs    08/09/18 1231 08/09/18 1820  TROPONINI 5.27* 30.64*    Cardiac Studies: EKG 08/09/2018 & 08/10/2018: Sinus rhythm, LAFB. Early R wave progression. No ischemic changes.  Echocardiogram 08/09/2018: - Left ventricle: The cavity size was normal. There was mild   concentric hypertrophy. Systolic function was normal. The   estimated ejection fraction was in the range of 55% to 60%.   Hypokinesis of the inferior myocardium. Doppler parameters are   consistent with abnormal left ventricular relaxation (grade 1   diastolic dysfunction). - Aortic valve: Mildly calcified annulus. Moderately calcified   leaflets. There was mild to moderate stenosis. Vmax 1.9 m/sec,   mean PG 8 mmHg, AVA 1.3 cm2. AVAi 0.6 cm2. Mean gradient appears   discordant with 2D appearance and AVA calculation by VTI method. - No other significant valvular abnormality.  Assessment/Recommendations: 77 y/o AAM w/ uncontrolled hypertension, thickened aortic valve with mild to moderate stenosis, DM, h/o prostate cancer, admitted with chest pain, NSTEMI  NSTEMI: Peak trop 30 ng/mL. Normal EF with mild inferior hypokinesis. No acute ischemic changes on EKG. He is currently chest pain free, although he did have recurrent episodes 11/23 evening. If he has any recurrent chest pain, hemodynamic or electrical instability, he will need urgent cath today. If not, plan on performing cath on 11/25 morning. Possibility of multivessel CAD in patient with uncontrolled DM. Hold P2Y12i for now. Will load tomorrow, if PCI candidate.  Continue aspirin/lipitor/coreg/lisinopril Continue NTG drip at 30  mcg/min.  Aortic stenosis: Mild to moderate.   Hypertension: Historically uncontrolled. Continue coreg 6.25 mg bid, lisinopril 10 mg daily.  Type 2 DM: Uncontrolled. Insulin coverage for now. Will start metofrmin and Jardiance after discharge.  Hyperlipidemia: Continue lipitor.   I emphasized about medication  compliance.     LOS: 1 day    Chandani Rogowski J Soleil Mas 08/10/2018, 8:56 AM  [place], MD [facility]. [place] Pager: [phone] Office: [phone] If no answer Cell [phone]

## 2018-08-10 NOTE — H&P (View-Only) (Signed)
Subjective:  Patient two episodes of chest tightness 11/23 evening requiring 2 SL NTG and increase in IV NTG requirement.  Telemetry shows episodes of 5-6 beat NSVT.  Trop increased to 30 ng/mL.  Currently denies any chest pain/tightness  Objective:  Vital Signs in the last 24 hours: Temp:  [98 F (36.7 C)-98.5 F (36.9 C)] 98 F (36.7 C) (11/24 0739) Pulse Rate:  [69-89] 81 (11/24 0739) Resp:  [13-26] 24 (11/24 0739) BP: (103-162)/(60-100) 105/60 (11/24 0739) SpO2:  [94 %-99 %] 95 % (11/24 0739) Weight:  [88.5 kg] 88.5 kg (11/23 1308)  Intake/Output from previous day: 11/23 0701 - 11/24 0700 In: 669.4 [P.O.:480; I.V.:189.4] Out: 550 [Urine:550] Intake/Output from this shift: No intake/output data recorded.  Physical Exam: Physical Exam  Constitutional: He is oriented to person, place, and time. He appears well-developed and well-nourished. No distress.  HENT:  Head: Normocephalic and atraumatic.  Eyes: Pupils are equal, round, and reactive to light. Conjunctivae are normal.  Neck: Normal range of motion. Neck supple. No JVD present.  Cardiovascular: Normal rate, regular rhythm and intact distal pulses.  Murmur heard.  Crescendo systolic (Aortic area) murmur is present with a grade of 2/6. Pulmonary/Chest: Effort normal. He has no wheezes. He has rales.  Abdominal: Soft. Bowel sounds are normal. There is no tenderness. There is no rebound.  Musculoskeletal: Normal range of motion. He exhibits edema.  Lymphadenopathy:    He has no cervical adenopathy.  Neurological: He is alert and oriented to person, place, and time. No cranial nerve deficit.  Skin: Skin is warm and dry.  Psychiatric: He has a normal mood and affect.  Nursing note and vitals reviewed.    Lab Results: Recent Labs    08/09/18 0326 08/10/18 0231  WBC 7.5 12.9*  HGB 13.8 12.2*  PLT 218 212   Recent Labs    08/09/18 0326  NA 131*  K 3.7  CL 99  CO2 23  GLUCOSE 291*  BUN 8  CREATININE  0.90   Recent Labs    08/09/18 1231 08/09/18 1820  TROPONINI 5.27* 30.64*    Cardiac Studies: EKG 08/09/2018 & 08/10/2018: Sinus rhythm, LAFB. Early R wave progression. No ischemic changes.  Echocardiogram 08/09/2018: - Left ventricle: The cavity size was normal. There was mild   concentric hypertrophy. Systolic function was normal. The   estimated ejection fraction was in the range of 55% to 60%.   Hypokinesis of the inferior myocardium. Doppler parameters are   consistent with abnormal left ventricular relaxation (grade 1   diastolic dysfunction). - Aortic valve: Mildly calcified annulus. Moderately calcified   leaflets. There was mild to moderate stenosis. Vmax 1.9 m/sec,   mean PG 8 mmHg, AVA 1.3 cm2. AVAi 0.6 cm2. Mean gradient appears   discordant with 2D appearance and AVA calculation by VTI method. - No other significant valvular abnormality.  Assessment/Recommendations: 77 y/o AAM w/ uncontrolled hypertension, thickened aortic valve with mild to moderate stenosis, DM, h/o prostate cancer, admitted with chest pain, NSTEMI  NSTEMI: Peak trop 30 ng/mL. Normal EF with mild inferior hypokinesis. No acute ischemic changes on EKG. He is currently chest pain free, although he did have recurrent episodes 11/23 evening. If he has any recurrent chest pain, hemodynamic or electrical instability, he will need urgent cath today. If not, plan on performing cath on 11/25 morning. Possibility of multivessel CAD in patient with uncontrolled DM. Hold P2Y12i for now. Will load tomorrow, if PCI candidate.  Continue aspirin/lipitor/coreg/lisinopril Continue NTG drip at 30  mcg/min.  Aortic stenosis: Mild to moderate.   Hypertension: Historically uncontrolled. Continue coreg 6.25 mg bid, lisinopril 10 mg daily.  Type 2 DM: Uncontrolled. Insulin coverage for now. Will start metofrmin and Jardiance after discharge.  Hyperlipidemia: Continue lipitor.   I emphasized about medication  compliance.     LOS: 1 day    Ivania Teagarden J Zyhir Cappella 08/10/2018, 8:56 AM  [place], MD [facility]. [place] Pager: [phone] Office: [phone] If no answer Cell [phone]

## 2018-08-10 NOTE — Progress Notes (Signed)
ANTICOAGULATION CONSULT NOTE  Pharmacy Consult for Heparin Indication: chest pain/ACS  No Known Allergies  Patient Measurements: Height: 6\' 1"  (185.4 cm) Weight: 195 lb 1.7 oz (88.5 kg) IBW/kg (Calculated) : 79.9  Vital Signs: Temp: 98 F (36.7 C) (11/24 0739) Temp Source: Oral (11/24 0739) BP: 121/65 (11/24 0954) Pulse Rate: 84 (11/24 0800)  Labs: Recent Labs    08/09/18 0326  08/09/18 1231 08/09/18 1820 08/10/18 0231 08/10/18 0936  HGB 13.8  --   --   --  12.2*  --   HCT 41.1  --   --   --  37.0*  --   PLT 218  --   --   --  212  --   HEPARINUNFRC  --   --  0.36  --  0.41  --   CREATININE 0.90  --   --   --   --   --   TROPONINI  --    < > 5.27* 30.64*  --  33.43*   < > = values in this interval not displayed.    Estimated Creatinine Clearance: 76.4 mL/min (by C-G formula based on SCr of 0.9 mg/dL).   Medical History: Past Medical History:  Diagnosis Date  . Arthritis   . Diabetes mellitus ([place])   . Prostate cancer ([place]) 2003    Medications:  No current facility-administered medications on file prior to encounter.    Current Outpatient Medications on File Prior to Encounter  Medication Sig Dispense Refill  . aspirin 81 MG tablet Take 81 mg by mouth once.        Assessment: 77 y.o. male with chest pain for heparin.  No AC PTA. Troponin 0.4>5.27>33.43.   Heparin level this morning came back therapeutic at 0.41, on 1100 units/hr. Hgb 12.2, plt 212. No s/sx of bleeding. No infusion issues.   Goal of Therapy:  Heparin level 0.3-0.7 units/ml Monitor platelets by anticoagulation protocol: Yes   Plan:  Continue heparin gtt to 1100 units/hr Monitor daily HL with morning labs Monitor CBC, s/s bleeding  Antonietta Jewel, PharmD Clinical Pharmacist  Pager: [phone] Phone: [phone] Please check AMION for all [place] numbers 08/10/2018 10:51 AM

## 2018-08-11 ENCOUNTER — Encounter (HOSPITAL_COMMUNITY)
Admission: EM | Disposition: A | Discharge status: Home / Self Care | Payer: Self-pay | Source: Home / Self Care | Attending: Cardiology

## 2018-08-11 ENCOUNTER — Encounter (HOSPITAL_COMMUNITY): Payer: Self-pay | Admitting: Cardiology

## 2018-08-11 HISTORY — PX: LEFT HEART CATH AND CORONARY ANGIOGRAPHY: CATH118249

## 2018-08-11 LAB — BASIC METABOLIC PANEL
Anion gap: 6 (ref 5–15)
BUN: 13 mg/dL (ref 8–23)
CALCIUM: 8.7 mg/dL — AB (ref 8.9–10.3)
CO2: 25 mmol/L (ref 22–32)
Chloride: 102 mmol/L (ref 98–111)
Creatinine, Ser: 0.98 mg/dL (ref 0.61–1.24)
GFR calc Af Amer: >60 mL/min (ref >60)
GLUCOSE: 167 mg/dL — AB (ref 70–99)
POTASSIUM: 4 mmol/L (ref 3.5–5.1)
SODIUM: 133 mmol/L — AB (ref 135–145)

## 2018-08-11 LAB — CBC WITH DIFFERENTIAL/PLATELET
Abs Immature Granulocytes: 0.05 10*3/uL (ref 0.00–0.07)
BASOS PCT: 1 %
Basophils Absolute: 0.1 10*3/uL (ref 0.0–0.1)
EOS ABS: 0.3 10*3/uL (ref 0.0–0.5)
EOS PCT: 2 %
HCT: 34.8 % — ABNORMAL LOW (ref 39.0–52.0)
Hemoglobin: 11.5 g/dL — ABNORMAL LOW (ref 13.0–17.0)
Immature Granulocytes: 0 %
Lymphocytes Relative: 21 %
Lymphs Abs: 2.6 10*3/uL (ref 0.7–4.0)
MCH: 29.9 pg (ref 26.0–34.0)
MCHC: 33 g/dL (ref 30.0–36.0)
MCV: 90.4 fL (ref 80.0–100.0)
MONO ABS: 1.8 10*3/uL — AB (ref 0.1–1.0)
MONOS PCT: 15 %
NEUTROS ABS: 7.6 10*3/uL (ref 1.7–7.7)
Neutrophils Relative %: 61 %
PLATELETS: 202 10*3/uL (ref 150–400)
RBC: 3.85 MIL/uL — AB (ref 4.22–5.81)
RDW: 12 % (ref 11.5–15.5)
WBC: 12.4 10*3/uL — AB (ref 4.0–10.5)
nRBC: 0 % (ref 0.0–0.2)

## 2018-08-11 LAB — GLUCOSE, CAPILLARY
GLUCOSE-CAPILLARY: 150 mg/dL — AB (ref 70–99)
Glucose-Capillary: 158 mg/dL — ABNORMAL HIGH (ref 70–99)
Glucose-Capillary: 211 mg/dL — ABNORMAL HIGH (ref 70–99)
Glucose-Capillary: 116 mg/dL — ABNORMAL HIGH (ref 70–99)

## 2018-08-11 LAB — HEPARIN LEVEL (UNFRACTIONATED): HEPARIN UNFRACTIONATED: 0.3 [IU]/mL (ref 0.30–0.70)

## 2018-08-11 SURGERY — LEFT HEART CATH AND CORONARY ANGIOGRAPHY
Anesthesia: LOCAL

## 2018-08-11 MED ORDER — VERAPAMIL HCL 2.5 MG/ML IV SOLN
INTRA_ARTERIAL | Status: DC | PRN
  Administered 2018-08-11: 10 mL via INTRA_ARTERIAL

## 2018-08-11 MED ORDER — SODIUM CHLORIDE 0.9% FLUSH: 3.0000 mL | INTRAVENOUS | Status: DC | PRN

## 2018-08-11 MED ORDER — HEPARIN (PORCINE) IN NACL 1000-0.9 UT/500ML-% IV SOLN
INTRAVENOUS | Status: AC
  Filled 2018-08-11: qty 500

## 2018-08-11 MED ORDER — SODIUM CHLORIDE 0.9 % IV SOLN: 250.0000 mL | INTRAVENOUS | Status: DC | PRN

## 2018-08-11 MED ORDER — SODIUM CHLORIDE 0.9 % IV SOLN
INTRAVENOUS | Status: AC
  Administered 2018-08-11: 09:00:00 via INTRAVENOUS

## 2018-08-11 MED ORDER — SODIUM CHLORIDE 0.9% FLUSH
3.0000 mL | Freq: Two times a day (BID) | INTRAVENOUS | Status: DC
  Administered 2018-08-11: 3 mL via INTRAVENOUS

## 2018-08-11 MED ORDER — MIDAZOLAM HCL 2 MG/2ML IJ SOLN
INTRAMUSCULAR | Status: DC | PRN
  Administered 2018-08-11: 1 mg via INTRAVENOUS

## 2018-08-11 MED ORDER — ONDANSETRON HCL 4 MG/2ML IJ SOLN
4.0000 mg | Freq: Four times a day (QID) | INTRAMUSCULAR | Status: DC | PRN
  Administered 2018-08-11: 4 mg via INTRAVENOUS
  Filled 2018-08-11: qty 2

## 2018-08-11 MED ORDER — MIDAZOLAM HCL 2 MG/2ML IJ SOLN
INTRAMUSCULAR | Status: AC
  Filled 2018-08-11: qty 2

## 2018-08-11 MED ORDER — HEPARIN SODIUM (PORCINE) 1000 UNIT/ML IJ SOLN
INTRAMUSCULAR | Status: DC | PRN
  Administered 2018-08-11: 4500 [IU] via INTRAVENOUS

## 2018-08-11 MED ORDER — SODIUM CHLORIDE 0.9 % IV SOLN
INTRAVENOUS | Status: AC
  Administered 2018-08-11 – 2018-08-12 (×2): via INTRAVENOUS

## 2018-08-11 MED ORDER — ASPIRIN 81 MG PO CHEW
81.0000 mg | CHEWABLE_TABLET | Freq: Every day | ORAL | Status: AC
  Administered 2018-08-12: 81 mg via ORAL
  Filled 2018-08-11: qty 1

## 2018-08-11 MED ORDER — ACETAMINOPHEN 325 MG PO TABS: 650.0000 mg | ORAL_TABLET | ORAL | Status: DC | PRN

## 2018-08-11 MED ORDER — PRASUGREL HCL 10 MG PO TABS
10.0000 mg | ORAL_TABLET | Freq: Every day | ORAL | Status: DC
  Administered 2018-08-12 – 2018-08-13 (×2): 10 mg via ORAL
  Filled 2018-08-11 (×2): qty 1

## 2018-08-11 MED ORDER — IOHEXOL 350 MG/ML SOLN
INTRAVENOUS | Status: DC | PRN
  Administered 2018-08-11: 55 mL via INTRA_ARTERIAL

## 2018-08-11 MED ORDER — HEPARIN SODIUM (PORCINE) 1000 UNIT/ML IJ SOLN
INTRAMUSCULAR | Status: AC
  Filled 2018-08-11: qty 1

## 2018-08-11 MED ORDER — SODIUM CHLORIDE 0.9% FLUSH
3.0000 mL | Freq: Two times a day (BID) | INTRAVENOUS | Status: DC
  Administered 2018-08-13: 10:00:00 3 mL via INTRAVENOUS

## 2018-08-11 MED ORDER — FENTANYL CITRATE (PF) 100 MCG/2ML IJ SOLN
INTRAMUSCULAR | Status: AC
  Filled 2018-08-11: qty 2

## 2018-08-11 MED ORDER — LIDOCAINE HCL (PF) 1 % IJ SOLN
INTRAMUSCULAR | Status: DC | PRN
  Administered 2018-08-11: 2 mL via INTRADERMAL

## 2018-08-11 MED ORDER — VERAPAMIL HCL 2.5 MG/ML IV SOLN
INTRAVENOUS | Status: AC
  Filled 2018-08-11: qty 2

## 2018-08-11 MED ORDER — PRASUGREL HCL 10 MG PO TABS
60.0000 mg | ORAL_TABLET | Freq: Once | ORAL | Status: AC
  Administered 2018-08-11: 60 mg via ORAL
  Filled 2018-08-11: qty 6

## 2018-08-11 MED ORDER — HEPARIN (PORCINE) 25000 UT/250ML-% IV SOLN
1250.0000 [IU]/h | INTRAVENOUS | Status: DC
  Administered 2018-08-11: 1150 [IU]/h via INTRAVENOUS
  Filled 2018-08-11: qty 250

## 2018-08-11 MED ORDER — FENTANYL CITRATE (PF) 100 MCG/2ML IJ SOLN
INTRAMUSCULAR | Status: DC | PRN
  Administered 2018-08-11: 50 ug via INTRAVENOUS

## 2018-08-11 MED ORDER — LIDOCAINE HCL (PF) 1 % IJ SOLN
INTRAMUSCULAR | Status: AC
  Filled 2018-08-11: qty 30

## 2018-08-11 MED ORDER — ASPIRIN 81 MG PO CHEW
81.0000 mg | CHEWABLE_TABLET | ORAL | Status: AC
  Administered 2018-08-12: 81 mg via ORAL
  Filled 2018-08-11: qty 1

## 2018-08-11 MED ORDER — HEPARIN (PORCINE) IN NACL 1000-0.9 UT/500ML-% IV SOLN
INTRAVENOUS | Status: DC | PRN
  Administered 2018-08-11 (×2): 500 mL

## 2018-08-11 SURGICAL SUPPLY — 10 items
CATH 5FR JL3.5 JR4 ANG PIG MP (CATHETERS) ×1 IMPLANT
CATH INFINITI 5FR AL1 (CATHETERS) ×1 IMPLANT
DEVICE RAD COMP TR BAND LRG (VASCULAR PRODUCTS) ×2 IMPLANT
GLIDESHEATH SLEND A-KIT 6F 22G (SHEATH) ×1 IMPLANT
GUIDEWIRE INQWIRE 1.5J.035X260 (WIRE) IMPLANT
INQWIRE 1.5J .035X260CM (WIRE) ×2
KIT HEART LEFT (KITS) ×2 IMPLANT
PACK CARDIAC CATHETERIZATION (CUSTOM PROCEDURE TRAY) ×2 IMPLANT
TRANSDUCER W/STOPCOCK (MISCELLANEOUS) ×2 IMPLANT
TUBING CIL FLEX 10 FLL-RA (TUBING) ×2 IMPLANT

## 2018-08-11 NOTE — H&P (View-Only) (Signed)
Subjective:  No recurrent chest pain episodes. Underwent diagnostic cath today showing severe multivessel CAD.   Objective:  Vital Signs in the last 24 hours: Temp:  [98 F (36.7 C)-99.1 F (37.3 C)] 98.1 F (36.7 C) (11/25 0827) Pulse Rate:  [0-90] 82 (11/25 1030) Resp:  [0-28] 28 (11/25 1030) BP: (108-139)/(64-82) 121/66 (11/25 1024) SpO2:  [0 %-100 %] 97 % (11/25 1030)  Intake/Output from previous day: 11/24 0701 - 11/25 0700 In: 480 [P.O.:480] Out: -  Intake/Output from this shift: No intake/output data recorded.  Physical Exam: Constitutional: He is oriented to person, place, and time. He appears well-developed and well-nourished. No distress.  HENT:  Head: Normocephalic and atraumatic.  Eyes: Pupils are equal, round, and reactive to light. Conjunctivae are normal.  Neck: Normal range of motion. Neck supple. No JVD present.  Cardiovascular: Normal rate, regular rhythm and intact distal pulses.  Murmur heard.  Crescendo systolic (Aortic area) murmur is present with a grade of 2/6. Pulmonary/Chest: Effort normal. He has no wheezes. He has rales.  Abdominal: Soft. Bowel sounds are normal. There is no tenderness. There is no rebound.  Musculoskeletal: Normal range of motion. He exhibits edema.  Lymphadenopathy:    He has no cervical adenopathy.  Neurological: He is alert and oriented to person, place, and time. No cranial nerve deficit.  Skin: Skin is warm and dry.  Psychiatric: He has a normal mood and affect.  Nursing note and vitals reviewed.   Lab Results: Recent Labs    08/10/18 0231 08/11/18 0245  WBC 12.9* 12.4*  HGB 12.2* 11.5*  PLT 212 202   Recent Labs    08/09/18 0326 08/11/18 0245  NA 131* 133*  K 3.7 4.0  CL 99 102  CO2 23 25  GLUCOSE 291* 167*  BUN 8 13  CREATININE 0.90 0.98   Recent Labs    08/09/18 1820 08/10/18 0936  TROPONINI 30.64* 33.43*   Cardiac Studies: EKG 08/09/2018 & 08/10/2018: Sinus rhythm, LAFB. Early R wave  progression. No ischemic changes.  Echocardiogram 08/09/2018: - Left ventricle: The cavity size was normal. There was mild concentric hypertrophy. Systolic function was normal. The estimated ejection fraction was in the range of 55% to 60%. Hypokinesis of the inferior myocardium. Doppler parameters are consistent with abnormal left ventricular relaxation (grade 1 diastolic dysfunction). - Aortic valve: Mildly calcified annulus. Moderately calcified leaflets. There was mild to moderate stenosis. Vmax 1.9 m/sec, mean PG 8 mmHg, AVA 1.3 cm2. AVAi 0.6 cm2. Mean gradient appears discordant with 2D appearance and AVA calculation by VTI method. - No other significant valvular abnormality.  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  Assessment/Recommendations: 77 y/o AAM w/ uncontrolled hypertension, thickened aortic valve with mild to  moderate stenosis, DM, h/o prostate cancer, admitted with chest pain, NSTEMI  NSTEMI: Peak trop 30 ng/mL. Normal EF with mild inferior hypokinesis.  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 Mid LAD with moderate stenosis. Severe lesions in Mid LCx, Diag 1, mid RCA. Syntax score 22 suggesting equipoise between CABG vs PCI.  I discussed surgical versus percutaneous revascularization options with patient and family.  With reasonable expectation for near complete  revascularization, PCI will be a comparable option CABG.  I offered the patient a surgical consultation as well.  Considering risks, benefits, perioperative and long-term care, patient would like to proceed with multivessel PCI.  I will plan on performing PCI to mid left circumflex, diagonal 1, possibly mid RCA subjective contrast and radiation exposure.  I may also evaluate mid LAD with dFR to assess physiological significance of moderate mid LAD lesion.  Schedule for cardiac catheterization, and possible intervention. We discussed risks, benefits, alternative options including continued medical therapy. Patient understands <1-2% risks of the following, including but not limited to, death, stroke, MI, urgent CABG, bleeding, infection, renal failure but not limited to these.  Continue aspirin/heparin/lipitor/coreg/lisinopril. Continue NTG drip at 30 mcg/min. Load with Effient 60 mg today, 10 mg daily from tomorrow.  Aortic stenosis: Mild, both on echocardiogram and left heart catheterization  Hypertension: Historically uncontrolled. Continue coreg 6.25 mg bid, lisinopril 10 mg daily.  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 Insulin coverage for now. Will start metofrmin and Jardiance after discharge.  Hyperlipidemia: Continue lipitor.   I emphasized about medication compliance.      LOS: 2 days    Connor Cross J Connor Cross 08/11/2018, 1:25 PM

## 2018-08-11 NOTE — Progress Notes (Signed)
Patient left unit for heart cath at this time

## 2018-08-11 NOTE — Progress Notes (Signed)
Subjective:  No recurrent chest pain episodes. Underwent diagnostic cath today showing severe multivessel CAD.   Objective:  Vital Signs in the last 24 hours: Temp:  [98 F (36.7 C)-99.1 F (37.3 C)] 98.1 F (36.7 C) (11/25 0827) Pulse Rate:  [0-90] 82 (11/25 1030) Resp:  [0-28] 28 (11/25 1030) BP: (108-139)/(64-82) 121/66 (11/25 1024) SpO2:  [0 %-100 %] 97 % (11/25 1030)  Intake/Output from previous day: 11/24 0701 - 11/25 0700 In: 480 [P.O.:480] Out: -  Intake/Output from this shift: No intake/output data recorded.  Physical Exam: Constitutional: He is oriented to person, place, and time. He appears well-developed and well-nourished. No distress.  HENT:  Head: Normocephalic and atraumatic.  Eyes: Pupils are equal, round, and reactive to light. Conjunctivae are normal.  Neck: Normal range of motion. Neck supple. No JVD present.  Cardiovascular: Normal rate, regular rhythm and intact distal pulses.  Murmur heard.  Crescendo systolic (Aortic area) murmur is present with a grade of 2/6. Pulmonary/Chest: Effort normal. He has no wheezes. He has rales.  Abdominal: Soft. Bowel sounds are normal. There is no tenderness. There is no rebound.  Musculoskeletal: Normal range of motion. He exhibits edema.  Lymphadenopathy:    He has no cervical adenopathy.  Neurological: He is alert and oriented to person, place, and time. No cranial nerve deficit.  Skin: Skin is warm and dry.  Psychiatric: He has a normal mood and affect.  Nursing note and vitals reviewed.   Lab Results: Recent Labs    08/10/18 0231 08/11/18 0245  WBC 12.9* 12.4*  HGB 12.2* 11.5*  PLT 212 202   Recent Labs    08/09/18 0326 08/11/18 0245  NA 131* 133*  K 3.7 4.0  CL 99 102  CO2 23 25  GLUCOSE 291* 167*  BUN 8 13  CREATININE 0.90 0.98   Recent Labs    08/09/18 1820 08/10/18 0936  TROPONINI 30.64* 33.43*   Cardiac Studies: EKG 08/09/2018 & 08/10/2018: Sinus rhythm, LAFB. Early R wave  progression. No ischemic changes.  Echocardiogram 08/09/2018: - Left ventricle: The cavity size was normal. There was mild concentric hypertrophy. Systolic function was normal. The estimated ejection fraction was in the range of 55% to 60%. Hypokinesis of the inferior myocardium. Doppler parameters are consistent with abnormal left ventricular relaxation (grade 1 diastolic dysfunction). - Aortic valve: Mildly calcified annulus. Moderately calcified leaflets. There was mild to moderate stenosis. Vmax 1.9 m/sec, mean PG 8 mmHg, AVA 1.3 cm2. AVAi 0.6 cm2. Mean gradient appears discordant with 2D appearance and AVA calculation by VTI method. - No other significant valvular abnormality.  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  Assessment/Recommendations: 77 y/o AAM w/ uncontrolled hypertension, thickened aortic valve with mild to  moderate stenosis, DM, h/o prostate cancer, admitted with chest pain, NSTEMI  NSTEMI: Peak trop 30 ng/mL. Normal EF with mild inferior hypokinesis.  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 Mid LAD with moderate stenosis. Severe lesions in Mid LCx, Diag 1, mid RCA. Syntax score 22 suggesting equipoise between CABG vs PCI.  I discussed surgical versus percutaneous revascularization options with patient and family.  With reasonable expectation for near complete  revascularization, PCI will be a comparable option CABG.  I offered the patient a surgical consultation as well.  Considering risks, benefits, perioperative and long-term care, patient would like to proceed with multivessel PCI.  I will plan on performing PCI to mid left circumflex, diagonal 1, possibly mid RCA subjective contrast and radiation exposure.  I may also evaluate mid LAD with dFR to assess physiological significance of moderate mid LAD lesion.  Schedule for cardiac catheterization, and possible intervention. We discussed risks, benefits, alternative options including continued medical therapy. Patient understands <1-2% risks of the following, including but not limited to, death, stroke, MI, urgent CABG, bleeding, infection, renal failure but not limited to these.  Continue aspirin/heparin/lipitor/coreg/lisinopril. Continue NTG drip at 30 mcg/min. Load with Effient 60 mg today, 10 mg daily from tomorrow.  Aortic stenosis: Mild, both on echocardiogram and left heart catheterization  Hypertension: Historically uncontrolled. Continue coreg 6.25 mg bid, lisinopril 10 mg daily.  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 Insulin coverage for now. Will start metofrmin and Jardiance after discharge.  Hyperlipidemia: Continue lipitor.   I emphasized about medication compliance.      LOS: 2 days    Connor Cross J Fransico Sciandra 08/11/2018, 1:25 PM

## 2018-08-11 NOTE — Interval H&P Note (Signed)
History and Physical Interval Note:  08/11/2018 7:28 AM  Connor Cross  has presented today for surgery, with the diagnosis of NSTEMI  The various methods of treatment have been discussed with the patient and family. After consideration of risks, benefits and other options for treatment, the patient has consented to  Procedure(s): LEFT HEART CATH AND CORONARY ANGIOGRAPHY (N/A) as a surgical intervention .  The patient's history has been reviewed, patient examined, no change in status, stable for surgery.  I have reviewed the patient's chart and labs.  Questions were answered to the patient's satisfaction.    2016 Appropriate Use Criteria for Coronary Revascularization in Patients With Acute Coronary Syndrome NSTEMI/UA High Risk (TIMI Score 5-7) NSTEMI/Unstable angina, stabilized patient at high risk Link Here: sistemancia.com Indication:  Revascularization by PCI or CABG of 1 or more arteries in a patient with NSTEMI or unstable angina with Stabilization after presentation High risk for clinical events  A (7) Indication: 16; Score 7    [place]

## 2018-08-11 NOTE — Progress Notes (Addendum)
ANTICOAGULATION CONSULT NOTE  Pharmacy Consult for Heparin Indication: chest pain/ACS  No Known Allergies  Patient Measurements: Height: 6\' 1"  (185.4 cm) Weight: 195 lb 1.7 oz (88.5 kg) IBW/kg (Calculated) : 79.9  Vital Signs: Temp: 98.1 F (36.7 C) (11/25 0827) Temp Source: Oral (11/25 0827) BP: 121/66 (11/25 1024) Pulse Rate: 80 (11/25 0827)  Labs: Recent Labs    08/09/18 0326  08/09/18 1231 08/09/18 1820 08/10/18 0231 08/10/18 0936 08/11/18 0245  HGB 13.8  --   --   --  12.2*  --  11.5*  HCT 41.1  --   --   --  37.0*  --  34.8*  PLT 218  --   --   --  212  --  202  HEPARINUNFRC  --   --  0.36  --  0.41  --  0.30  CREATININE 0.90  --   --   --   --   --  0.98  TROPONINI  --    < > 5.27* 30.64*  --  33.43*  --    < > = values in this interval not displayed.    Estimated Creatinine Clearance: 70.2 mL/min (by C-G formula based on SCr of 0.98 mg/dL).   Assessment: 77 y.o. male started on IV heparin for chest pain.  Now s/p cath with plan for surgical revascularization vs multivessel PCI.  Pharmacy consulted to restart IV heparin 8 hours post sheath removal.  Sheath removed at 0813 per Procedural Log.  Heparin level therapeutic this AM and has trended down.    Goal of Therapy:  Heparin level 0.3-0.7 units/ml Monitor platelets by anticoagulation protocol: Yes   Plan:  At 1615, restart heparin gtt at 1150 units/hr Check 8 hr heparin level Daily heparin level and CBC Monitor for s/sx of bleeding, hematoma   Constantino Starace D. Mina Marble, PharmD, BCPS, [place] 08/11/2018, 10:34 AM

## 2018-08-11 NOTE — Progress Notes (Signed)
Patient nauseated and vomited. Zofran given. No c/o chest pain or any other distress. Will continue to monitor.

## 2018-08-11 NOTE — Progress Notes (Signed)
Patient back from cath lab at this time. TR band intact, clean and dry. 8cc air per report. Patient back on telemetry and assessment done. Patient instructed on bedrest and left arm movement. Will continue to monitor.   Emelda Fear, RN

## 2018-08-12 ENCOUNTER — Encounter (HOSPITAL_COMMUNITY)
Admission: EM | Disposition: A | Discharge status: Home / Self Care | Payer: Self-pay | Source: Home / Self Care | Attending: Cardiology

## 2018-08-12 HISTORY — PX: CORONARY STENT INTERVENTION: CATH118234

## 2018-08-12 LAB — GLUCOSE, CAPILLARY
GLUCOSE-CAPILLARY: 102 mg/dL — AB (ref 70–99)
GLUCOSE-CAPILLARY: 137 mg/dL — AB (ref 70–99)
Glucose-Capillary: 121 mg/dL — ABNORMAL HIGH (ref 70–99)
Glucose-Capillary: 116 mg/dL — ABNORMAL HIGH (ref 70–99)
Glucose-Capillary: 168 mg/dL — ABNORMAL HIGH (ref 70–99)

## 2018-08-12 LAB — CBC WITH DIFFERENTIAL/PLATELET
Abs Immature Granulocytes: 0.06 10*3/uL (ref 0.00–0.07)
BASOS ABS: 0 10*3/uL (ref 0.0–0.1)
Basophils Relative: 0 %
EOS ABS: 0.2 10*3/uL (ref 0.0–0.5)
EOS PCT: 2 %
HEMATOCRIT: 33.4 % — AB (ref 39.0–52.0)
Hemoglobin: 11.2 g/dL — ABNORMAL LOW (ref 13.0–17.0)
Immature Granulocytes: 1 %
LYMPHS ABS: 1.8 10*3/uL (ref 0.7–4.0)
Lymphocytes Relative: 17 %
MCH: 30.2 pg (ref 26.0–34.0)
MCHC: 33.5 g/dL (ref 30.0–36.0)
MCV: 90 fL (ref 80.0–100.0)
MONOS PCT: 14 %
Monocytes Absolute: 1.5 10*3/uL — ABNORMAL HIGH (ref 0.1–1.0)
NRBC: 0 % (ref 0.0–0.2)
Neutro Abs: 7 10*3/uL (ref 1.7–7.7)
Neutrophils Relative %: 66 %
Platelets: 197 10*3/uL (ref 150–400)
RBC: 3.71 MIL/uL — ABNORMAL LOW (ref 4.22–5.81)
RDW: 11.9 % (ref 11.5–15.5)
WBC: 10.5 10*3/uL (ref 4.0–10.5)

## 2018-08-12 LAB — BASIC METABOLIC PANEL
Anion gap: 8 (ref 5–15)
BUN: 14 mg/dL (ref 8–23)
CHLORIDE: 101 mmol/L (ref 98–111)
CO2: 23 mmol/L (ref 22–32)
CREATININE: 0.96 mg/dL (ref 0.61–1.24)
Calcium: 8.7 mg/dL — ABNORMAL LOW (ref 8.9–10.3)
GFR calc Af Amer: >60 mL/min (ref >60)
GFR calc non Af Amer: >60 mL/min (ref >60)
Glucose, Bld: 163 mg/dL — ABNORMAL HIGH (ref 70–99)
POTASSIUM: 4.1 mmol/L (ref 3.5–5.1)
SODIUM: 132 mmol/L — AB (ref 135–145)

## 2018-08-12 LAB — POCT ACTIVATED CLOTTING TIME
ACTIVATED CLOTTING TIME: 268 s
Activated Clotting Time: 290 seconds
Activated Clotting Time: 345 seconds

## 2018-08-12 LAB — HEPARIN LEVEL (UNFRACTIONATED)
Heparin Unfractionated: 0.27 IU/mL — ABNORMAL LOW (ref 0.30–0.70)
Heparin Unfractionated: 0.29 IU/mL — ABNORMAL LOW (ref 0.30–0.70)

## 2018-08-12 SURGERY — CORONARY STENT INTERVENTION
Anesthesia: LOCAL

## 2018-08-12 MED ORDER — HEPARIN SODIUM (PORCINE) 1000 UNIT/ML IJ SOLN
INTRAMUSCULAR | Status: AC
  Filled 2018-08-12: qty 1

## 2018-08-12 MED ORDER — PRASUGREL HCL 10 MG PO TABS: 10.0000 mg | ORAL_TABLET | Freq: Every day | ORAL | 3 refills | Status: DC

## 2018-08-12 MED ORDER — SODIUM CHLORIDE 0.9% FLUSH
3.0000 mL | Freq: Two times a day (BID) | INTRAVENOUS | Status: DC
  Administered 2018-08-13: 3 mL via INTRAVENOUS

## 2018-08-12 MED ORDER — HYDRALAZINE HCL 20 MG/ML IJ SOLN: 5.0000 mg | INTRAMUSCULAR | Status: AC | PRN

## 2018-08-12 MED ORDER — CARVEDILOL 6.25 MG PO TABS: 6.2500 mg | ORAL_TABLET | Freq: Two times a day (BID) | ORAL | 3 refills | Status: DC

## 2018-08-12 MED ORDER — SODIUM CHLORIDE 0.9 % IV SOLN
INTRAVENOUS | Status: AC | PRN
  Administered 2018-08-12: 10 mL/h via INTRAVENOUS

## 2018-08-12 MED ORDER — SODIUM CHLORIDE 0.9 % IV SOLN
INTRAVENOUS | Status: AC
  Administered 2018-08-12: 15:00:00 via INTRAVENOUS

## 2018-08-12 MED ORDER — ANGIOPLASTY BOOK
Freq: Once | Status: DC
  Filled 2018-08-12: qty 1

## 2018-08-12 MED ORDER — LIDOCAINE HCL (PF) 1 % IJ SOLN
INTRAMUSCULAR | Status: AC
  Filled 2018-08-12: qty 30

## 2018-08-12 MED ORDER — HEPARIN SODIUM (PORCINE) 1000 UNIT/ML IJ SOLN
INTRAMUSCULAR | Status: DC | PRN
  Administered 2018-08-12: 3000 [IU] via INTRAVENOUS
  Administered 2018-08-12: 8000 [IU] via INTRAVENOUS

## 2018-08-12 MED ORDER — PRASUGREL HCL 10 MG PO TABS
ORAL_TABLET | ORAL | Status: AC
  Filled 2018-08-12: qty 1

## 2018-08-12 MED ORDER — VERAPAMIL HCL 2.5 MG/ML IV SOLN
INTRAVENOUS | Status: DC | PRN
  Administered 2018-08-12: 10 mL via INTRA_ARTERIAL

## 2018-08-12 MED ORDER — EMPAGLIFLOZIN 10 MG PO TABS: 10.0000 mg | ORAL_TABLET | Freq: Every day | ORAL | 3 refills | Status: AC

## 2018-08-12 MED ORDER — FENTANYL CITRATE (PF) 100 MCG/2ML IJ SOLN
INTRAMUSCULAR | Status: AC
  Filled 2018-08-12: qty 2

## 2018-08-12 MED ORDER — ATORVASTATIN CALCIUM 80 MG PO TABS: 80.0000 mg | ORAL_TABLET | Freq: Every day | ORAL | 3 refills | Status: DC

## 2018-08-12 MED ORDER — HEPARIN (PORCINE) IN NACL 1000-0.9 UT/500ML-% IV SOLN
INTRAVENOUS | Status: DC | PRN
  Administered 2018-08-12: 500 mL

## 2018-08-12 MED ORDER — NITROGLYCERIN 1 MG/10 ML FOR IR/CATH LAB
INTRA_ARTERIAL | Status: AC
  Filled 2018-08-12: qty 10

## 2018-08-12 MED ORDER — METFORMIN HCL ER 750 MG PO TB24: 750.0000 mg | ORAL_TABLET | Freq: Every day | ORAL | 11 refills | Status: DC

## 2018-08-12 MED ORDER — HEART ATTACK BOUNCING BOOK
Freq: Once | Status: AC
  Administered 2018-08-12: 1
  Filled 2018-08-12: qty 1

## 2018-08-12 MED ORDER — LABETALOL HCL 5 MG/ML IV SOLN: 10.0000 mg | INTRAVENOUS | Status: AC | PRN

## 2018-08-12 MED ORDER — HEPARIN (PORCINE) IN NACL 1000-0.9 UT/500ML-% IV SOLN
INTRAVENOUS | Status: AC
  Filled 2018-08-12: qty 1000

## 2018-08-12 MED ORDER — SODIUM CHLORIDE 0.9% FLUSH: 3.0000 mL | INTRAVENOUS | Status: DC | PRN

## 2018-08-12 MED ORDER — IOHEXOL 350 MG/ML SOLN
INTRAVENOUS | Status: DC | PRN
  Administered 2018-08-12: 195 mL via INTRA_ARTERIAL

## 2018-08-12 MED ORDER — MIDAZOLAM HCL 2 MG/2ML IJ SOLN
INTRAMUSCULAR | Status: DC | PRN
  Administered 2018-08-12: 1 mg via INTRAVENOUS

## 2018-08-12 MED ORDER — FENTANYL CITRATE (PF) 100 MCG/2ML IJ SOLN
INTRAMUSCULAR | Status: DC | PRN
  Administered 2018-08-12: 50 ug via INTRAVENOUS

## 2018-08-12 MED ORDER — MIDAZOLAM HCL 2 MG/2ML IJ SOLN
INTRAMUSCULAR | Status: AC
  Filled 2018-08-12: qty 2

## 2018-08-12 MED ORDER — SODIUM CHLORIDE 0.9 % IV SOLN: 250.0000 mL | INTRAVENOUS | Status: DC | PRN

## 2018-08-12 MED ORDER — NITROGLYCERIN 0.4 MG SL SUBL: 0.4000 mg | SUBLINGUAL_TABLET | SUBLINGUAL | 2 refills | Status: DC | PRN

## 2018-08-12 MED ORDER — ACETAMINOPHEN 325 MG PO TABS: 650.0000 mg | ORAL_TABLET | ORAL | Status: DC | PRN

## 2018-08-12 MED ORDER — ASPIRIN 81 MG PO CHEW: 81.0000 mg | CHEWABLE_TABLET | Freq: Every day | ORAL | 11 refills | Status: DC

## 2018-08-12 MED ORDER — VERAPAMIL HCL 2.5 MG/ML IV SOLN
INTRAVENOUS | Status: AC
  Filled 2018-08-12: qty 2

## 2018-08-12 MED ORDER — LIDOCAINE HCL (PF) 1 % IJ SOLN
INTRAMUSCULAR | Status: DC | PRN
  Administered 2018-08-12: 3 mL

## 2018-08-12 MED ORDER — LISINOPRIL 10 MG PO TABS: 10.0000 mg | ORAL_TABLET | Freq: Every day | ORAL | 3 refills | Status: DC

## 2018-08-12 MED ORDER — PRASUGREL HCL 10 MG PO TABS
10.0000 mg | ORAL_TABLET | Freq: Once | ORAL | Status: DC
  Administered 2018-08-12: 14:00:00 10 mg via ORAL

## 2018-08-12 MED ORDER — ONDANSETRON HCL 4 MG/2ML IJ SOLN: 4.0000 mg | Freq: Four times a day (QID) | INTRAMUSCULAR | Status: DC | PRN

## 2018-08-12 SURGICAL SUPPLY — 21 items
BALLN SAPPHIRE 2.5X15 (BALLOONS) ×2
BALLN SAPPHIRE ~~LOC~~ 2.75X8 (BALLOONS) ×1 IMPLANT
BALLOON SAPPHIRE 2.5X15 (BALLOONS) ×1 IMPLANT
CATH LAUNCHER 6FR AL.75 (CATHETERS) ×2 IMPLANT
CATH LAUNCHER 6FR JR4 (CATHETERS) ×1 IMPLANT
DEVICE RAD COMP TR BAND LRG (VASCULAR PRODUCTS) ×1 IMPLANT
ELECT DEFIB PAD ADLT CADENCE (PAD) ×1 IMPLANT
GLIDESHEATH SLEND A-KIT 6F 22G (SHEATH) ×2 IMPLANT
GUIDEWIRE INQWIRE 1.5J.035X260 (WIRE) IMPLANT
INQWIRE 1.5J .035X260CM (WIRE) ×2
KIT ENCORE 26 ADVANTAGE (KITS) ×2 IMPLANT
KIT HEART LEFT (KITS) ×2 IMPLANT
KIT HEMO VALVE WATCHDOG (MISCELLANEOUS) ×2 IMPLANT
PACK CARDIAC CATHETERIZATION (CUSTOM PROCEDURE TRAY) ×2 IMPLANT
STENT RESOLUTE ONYX 2.5X22 (Permanent Stent) ×1 IMPLANT
STENT RESOLUTE ONYX 3.0X15 (Permanent Stent) ×2 IMPLANT
STENT RESOLUTE ONYX 3.5X18 (Permanent Stent) ×1 IMPLANT
TRANSDUCER W/STOPCOCK (MISCELLANEOUS) ×2 IMPLANT
TUBING CIL FLEX 10 FLL-RA (TUBING) ×2 IMPLANT
WIRE MINAMO 190 (WIRE) ×2 IMPLANT
WIRE SION BLUE 180 (WIRE) ×1 IMPLANT

## 2018-08-12 NOTE — Progress Notes (Signed)
Subjective:  No chest pain Doing well post PCI  Objective:  Vital Signs in the last 24 hours: Temp:  [97.9 F (36.6 C)-99 F (37.2 C)] 97.9 F (36.6 C) (11/26 1613) Pulse Rate:  [74-113] 79 (11/26 1613) Resp:  [11-42] 20 (11/26 1613) BP: (112-175)/(60-95) 140/73 (11/26 1613) SpO2:  [0 %-100 %] 99 % (11/26 1438) Weight:  [87.5 kg] 87.5 kg (11/25 2156)  Intake/Output from previous day: 11/25 0701 - 11/26 0700 In: 1589.5 [P.O.:240; I.V.:1349.5] Out: 52 [Urine:201; Stool:2] Intake/Output from this shift: Total I/O In: 240 [P.O.:240] Out: 600 [Urine:600]  Physical Exam:  Constitutional: He isoriented to person, place, and time. He appearswell-developedand well-nourished.No distress.  HENT:  Head:Normocephalicand atraumatic.  Eyes:Pupils are equal, round, and reactive to light.Conjunctivaeare normal.  Neck:Normal range of motion.Neck supple.No JVDpresent.  Cardiovascular:Normal rate,regular rhythmand intact distal pulses. Murmurheard. Crescendosystolic(Aortic area)murmur is present with a grade of 2/6. TR band placed on right wrist Pulmonary/Chest:Effort normal. He hasno wheezes. He hasno rales  Abdominal:Soft.Bowel sounds are normal. There isno tenderness. There isno rebound.  Musculoskeletal:Normal range of motion. He exhibits no edema  Lymphadenopathy:  He has no cervical adenopathy.  Neurological: He isalertand oriented to person, place, and time. Nocranial nerve deficit.  Skin: Skin iswarmand dry.  Psychiatric: He has anormal mood and affect. Nursing noteand vitalsreviewed.   Lab Results: Recent Labs    08/11/18 0245 08/12/18 0107  WBC 12.4* 10.5  HGB 11.5* 11.2*  PLT 202 197   Recent Labs    08/11/18 0245 08/12/18 0107  NA 133* 132*  K 4.0 4.1  CL 102 101  CO2 25 23  GLUCOSE 167* 163*  BUN 13 14  CREATININE 0.98 0.96   Recent Labs    08/09/18 1820 08/10/18 0936  TROPONINI 30.64* 33.43*     Cardiac  Studies: EKG 08/09/2018 & 08/10/2018: Sinus rhythm, LAFB. Early R wave progression. No ischemic changes.  Echocardiogram 08/09/2018: - Left ventricle: The cavity size was normal. There was mild concentric hypertrophy. Systolic function was normal. The estimated ejection fraction was in the range of 55% to 60%. Hypokinesis of the inferior myocardium. Doppler parameters are consistent with abnormal left ventricular relaxation (grade 1 diastolic dysfunction). - Aortic valve: Mildly calcified annulus. Moderately calcified leaflets. There was mild to moderate stenosis. Vmax 1.9 m/sec, mean PG 8 mmHg, AVA 1.3 cm2. AVAi 0.6 cm2. Mean gradient appears discordant with 2D appearance and AVA calculation by VTI method. - No other significant valvular abnormality.  LHC & PCI 08/12/2018: LM: Distal calcific 30% stenosis LAD: Mid LAD 50% stenosis          Diag 1 95% stenosis--->Successful PTCA and stent placement          Resolute 2.5 X 22 mm DES Diag1  LCx: Ostial 40% calcific stenosis Mid LCx 95% stenosis--->Successful PTCA and stent placement          Resolute 3.0 X 15 mm DES mid LCx Distal LCx/OM bifurcation 60% stenosis RCA: Prox 20% diffuse disease Mid 80% stenosis--->Successful PTCA and stent placement           Resolute 3.5 X 18 mm DES mid RCA Distal diffuse 40-50% disease Mid PDA 60% stenosis at branching point of septal perforators  Residual mild to moderate disease to be treated medically Needs aggressive risk factor modification, especially diabetes and blood pressure control.   Recommend uninterrupted dual antiplatelet therapy with Aspirin 81mg  daily and Prasugrel 10mg  daily for a minimum of 12 months (ACS - Class I recommendation).   Assessment/Recommendations:  77 y/o AAM w/ uncontrolled hypertension, thickened aortic valve with mild to moderate stenosis, DM, h/o prostate cancer, admitted with chest pain,  NSTEMI  NSTEMI: Peak trop 30 ng/mL. Normal EF with mild inferior hypokinesis.  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 Mid LAD with moderate stenosis. Severe lesions in Mid LCx, Diag 1, mid RCA. Syntax score 22 suggesting equipoise between CABG vs PCI.  I discussed surgical versus percutaneous revascularization options with patient and family.  With reasonable expectation for near complete revascularization, PCI will be a comparable option CABG.  I offered the patient a surgical consultation as well.  Considering risks, benefits, perioperative and long-term care, patient opted to proceed with multivessel PCI.  I  Patient underwent successful multivessel PCI to Diag1 , Mid LCx, and mid RCA  Continue aspirin/prasugrel/lipitor/coreg/lisinopril. Given patient's vomiting episode after loading 60 mg Effient on 11/25, I gave an additional dose of 10 mg today. Recommend DAPT for at least 1 year.  Will discuss regardign participation in [place] trial  Aortic stenosis: Mild, both on echocardiogram and left heart catheterization  Hypertension: Historically uncontrolled. Continue coreg 6.25 mg bid, lisinopril 10 mg daily.  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 Insulin coverage for now. Will start metofrmin and Jardiance on discharge.  Hyperlipidemia: Continue lipitor.   I emphasized about medication compliance.  Likely discharge tomorrow.     LOS: 3 days    Connor Cross J Connor Cross 08/12/2018, 4:46 PM  [place], MD [facility] Pager: [phone] Office: [phone] If no answer Cell [phone]

## 2018-08-12 NOTE — Care Management (Signed)
#    3.   S/W  NICO  @ [facility] # [phone]   1. EFFIENT  10 MG DAILY COVER- : NOT COVER PRIOR APPROVAL- YES # [phone]  FOR EXCEPTION  2. PRASUGREL  10 MG BID COVER- YES CO-PAY- $ 7.91 TIER- 4 DRUG PRIOR APPROVAL- NO   PREFERRED PHARMACY :  [facility]

## 2018-08-12 NOTE — Progress Notes (Signed)
Patient to the cath lab.

## 2018-08-12 NOTE — Progress Notes (Signed)
ANTICOAGULATION CONSULT NOTE  Pharmacy Consult for Heparin Indication: chest pain/ACS  No Known Allergies  Patient Measurements: Height: 6\' 1"  (185.4 cm) Weight: 192 lb 14.4 oz (87.5 kg) IBW/kg (Calculated) : 79.9  Vital Signs: Temp: 98.8 F (37.1 C) (11/25 2327) Temp Source: Oral (11/25 2327) BP: 112/71 (11/25 2327) Pulse Rate: 90 (11/25 2327)  Labs: Recent Labs    08/09/18 1231 08/09/18 1820  08/10/18 0231 08/10/18 0936 08/11/18 0245 08/12/18 0107  HGB  --   --    < > 12.2*  --  11.5* 11.2*  HCT  --   --   --  37.0*  --  34.8* 33.4*  PLT  --   --   --  212  --  202 197  HEPARINUNFRC 0.36  --   --  0.41  --  0.30 0.27*  CREATININE  --   --   --   --   --  0.98 0.96  TROPONINI 5.27* 30.64*  --   --  33.43*  --   --    < > = values in this interval not displayed.    Estimated Creatinine Clearance: 71.7 mL/min (by C-G formula based on SCr of 0.96 mg/dL).   Assessment: 77 y.o. male with CAD awaiting PCI today for heparin   Goal of Therapy:  Heparin level 0.3-0.7 units/ml Monitor platelets by anticoagulation protocol: Yes   Plan:  Increase Heparin  1250 units/hr  Phillis Knack, PharmD, BCPS  08/12/2018, 3:45 AM

## 2018-08-12 NOTE — Progress Notes (Signed)
TR BAND REMOVAL  LOCATION:  right radial  DEFLATED PER PROTOCOL:  Yes.    TIME BAND OFF / DRESSING APPLIED:   1730   SITE UPON ARRIVAL:   Level 0  SITE AFTER BAND REMOVAL:  Level 0  CIRCULATION SENSATION AND MOVEMENT:  Within Normal Limits  Yes.    COMMENTS:    

## 2018-08-12 NOTE — Interval H&P Note (Signed)
History and Physical Interval Note:  08/12/2018 11:33 AM  Connor Cross  has presented today for surgery, with the diagnosis of cad  The various methods of treatment have been discussed with the patient and family. After consideration of risks, benefits and other options for treatment, the patient has consented to  Procedure(s): CORONARY STENT INTERVENTION (N/A) as a surgical intervention .  The patient's history has been reviewed, patient examined, no change in status, stable for surgery.  I have reviewed the patient's chart and labs.  Questions were answered to the patient's satisfaction.    2016 Appropriate Use Criteria for Coronary Revascularization in Patients With Acute Coronary Syndrome NSTEMI/UA High Risk (TIMI Score 5-7) NSTEMI/Unstable angina, stabilized patient at high risk Link Here: sistemancia.com Indication:  Revascularization by PCI or CABG of 1 or more arteries in a patient with NSTEMI or unstable angina with Stabilization after presentation High risk for clinical events  A (7) Indication: 16; Score 7     [name]

## 2018-08-13 ENCOUNTER — Encounter (HOSPITAL_COMMUNITY): Payer: Self-pay | Admitting: Cardiology

## 2018-08-13 DIAGNOSIS — Z8546 Personal history of malignant neoplasm of prostate: Secondary | ICD-10-CM

## 2018-08-13 DIAGNOSIS — I350 Nonrheumatic aortic (valve) stenosis: Secondary | ICD-10-CM

## 2018-08-13 DIAGNOSIS — E119 Type 2 diabetes mellitus without complications: Secondary | ICD-10-CM

## 2018-08-13 DIAGNOSIS — I10 Essential (primary) hypertension: Secondary | ICD-10-CM

## 2018-08-13 DIAGNOSIS — I214 Non-ST elevation (NSTEMI) myocardial infarction: Secondary | ICD-10-CM

## 2018-08-13 LAB — CBC WITH DIFFERENTIAL/PLATELET
ABS IMMATURE GRANULOCYTES: 0.03 10*3/uL (ref 0.00–0.07)
BASOS PCT: 0 %
Basophils Absolute: 0 10*3/uL (ref 0.0–0.1)
EOS ABS: 0.3 10*3/uL (ref 0.0–0.5)
Eosinophils Relative: 3 %
HCT: 36.8 % — ABNORMAL LOW (ref 39.0–52.0)
Hemoglobin: 11.8 g/dL — ABNORMAL LOW (ref 13.0–17.0)
IMMATURE GRANULOCYTES: 0 %
Lymphocytes Relative: 20 %
Lymphs Abs: 1.9 10*3/uL (ref 0.7–4.0)
MCH: 29.4 pg (ref 26.0–34.0)
MCHC: 32.1 g/dL (ref 30.0–36.0)
MCV: 91.5 fL (ref 80.0–100.0)
MONO ABS: 1.4 10*3/uL — AB (ref 0.1–1.0)
Monocytes Relative: 15 %
NEUTROS ABS: 5.9 10*3/uL (ref 1.7–7.7)
NEUTROS PCT: 62 %
PLATELETS: 233 10*3/uL (ref 150–400)
RBC: 4.02 MIL/uL — AB (ref 4.22–5.81)
RDW: 11.9 % (ref 11.5–15.5)
WBC: 9.5 10*3/uL (ref 4.0–10.5)
nRBC: 0 % (ref 0.0–0.2)

## 2018-08-13 LAB — GLUCOSE, CAPILLARY: GLUCOSE-CAPILLARY: 114 mg/dL — AB (ref 70–99)

## 2018-08-13 LAB — LIPID PANEL
CHOLESTEROL: 142 mg/dL (ref 0–200)
HDL: 28 mg/dL — ABNORMAL LOW (ref >40)
LDL Cholesterol: 100 mg/dL — ABNORMAL HIGH (ref 0–99)
Total CHOL/HDL Ratio: 5.1 RATIO
Triglycerides: 68 mg/dL (ref <150)
VLDL: 14 mg/dL (ref 0–40)

## 2018-08-13 LAB — BASIC METABOLIC PANEL
ANION GAP: 6 (ref 5–15)
BUN: 11 mg/dL (ref 8–23)
CO2: 25 mmol/L (ref 22–32)
Calcium: 8.7 mg/dL — ABNORMAL LOW (ref 8.9–10.3)
Chloride: 104 mmol/L (ref 98–111)
Creatinine, Ser: 1.08 mg/dL (ref 0.61–1.24)
Glucose, Bld: 136 mg/dL — ABNORMAL HIGH (ref 70–99)
POTASSIUM: 4 mmol/L (ref 3.5–5.1)
SODIUM: 135 mmol/L (ref 135–145)

## 2018-08-13 MED ORDER — GLUCOSE BLOOD VI STRP: ORAL_STRIP | 2 refills | Status: AC

## 2018-08-13 MED ORDER — ATORVASTATIN CALCIUM 80 MG PO TABS: 80.0000 mg | ORAL_TABLET | Freq: Every day | ORAL | 2 refills | Status: DC

## 2018-08-13 MED ORDER — LISINOPRIL 10 MG PO TABS: 10.0000 mg | ORAL_TABLET | Freq: Every day | ORAL | 2 refills | Status: DC

## 2018-08-13 MED ORDER — CARVEDILOL 6.25 MG PO TABS: 6.2500 mg | ORAL_TABLET | Freq: Two times a day (BID) | ORAL | 2 refills | Status: DC

## 2018-08-13 MED ORDER — PRASUGREL HCL 10 MG PO TABS: 10.0000 mg | ORAL_TABLET | Freq: Every day | ORAL | 2 refills | Status: DC

## 2018-08-13 MED ORDER — ASPIRIN EC 81 MG PO TBEC
81.0000 mg | DELAYED_RELEASE_TABLET | Freq: Every day | ORAL | Status: DC
  Administered 2018-08-13: 81 mg via ORAL
  Filled 2018-08-13: qty 1

## 2018-08-13 MED ORDER — GLUCOSE BLOOD VI STRP: ORAL_STRIP | 2 refills | Status: DC

## 2018-08-13 MED ORDER — NITROGLYCERIN 0.4 MG SL SUBL: 0.4000 mg | SUBLINGUAL_TABLET | SUBLINGUAL | 1 refills | Status: DC | PRN

## 2018-08-13 MED FILL — PRASUGREL HCL 10 MG TABS: 10 | 30 days supply | Qty: 30 | Fill #0

## 2018-08-13 MED FILL — CARVEDILOL 6.25 MG TABLET: 6.25 | 30 days supply | Qty: 60 | Fill #0

## 2018-08-13 MED FILL — LISINOPRIL 10 MG TABS: 10 | 30 days supply | Qty: 30 | Fill #0

## 2018-08-13 MED FILL — ASPIRIN LOW DOSE 81 MG CHEW: 81 | 36 days supply | Qty: 36 | Fill #0

## 2018-08-13 MED FILL — NITROGLYCERIN 0.4 MG TAB SL: 0.4 | 8 days supply | Qty: 25 | Fill #0

## 2018-08-13 MED FILL — ATORVASTATIN CALCIUM 80 MG: 80 | 30 days supply | Qty: 30 | Fill #0

## 2018-08-13 NOTE — Discharge Summary (Signed)
Physician Discharge Summary  Patient ID: Connor Cross MRN: [id number] DOB/AGE: 12-05-1940 77 y.o.  Admit date: 08/09/2018 Discharge date: 08/13/2018  Admission Diagnoses: Active Problems:   ACS (acute coronary syndrome) (HCC)   NSTEMI (non-ST elevated myocardial infarction) ([place])   Essential hypertension   Type 2 diabetes mellitus (HCC)\   Mild aortic stenosis   H/O prostate cancer   Discharge Diagnoses:  Active Problems:   ACS (acute coronary syndrome) (HCC)   NSTEMI (non-ST elevated myocardial infarction) ([place])   Essential hypertension   Type 2 diabetes mellitus ([place])   H/O prostate cancer   Mild aortic stenosis   Discharged Condition: stable  Hospital Course:   77 y/o AAM w/ uncontrolled hypertension, thickened aortic valve with mild to moderate stenosis, DM, h/o prostate cancer, admitted with chest pain, NSTEMI. Peak trop 30 ng/mL. Normal EF with mild inferior hypokinesis.Cath showed severe multivessel CAD without severe LM involvement. I discussed surgical versus percutaneous revascularization options with patient and family. With reasonable expectation for near complete revascularization, PCI will be a comparable option CABG. I offered the patient a surgical consultation as well. Considering risks, benefits, perioperative and long-term care, patient opted to proceed with multivessel PCI. Patient underwent successful multivessel PCI to Diag1 , Mid LCx, and mid RCA, as detailed below.   Patient's rest of hospital stay was uneventful. He walked in on. Her cardiac rehabilitation without any chest pain. Patient was discharged on antiplatelet therapy with aspirin and Prasugrel, high intensity statin Lipitor, atenolol and lisinopril. I also prescribed metformin and Jardiance for his diabetes management. He will resume metformin on 07/30/2018 given IV contrast use. I encouraged him to follow up with his PCP regarding his diabetes management.  Consults: None  Significant  Diagnostic Studies: labs:   Results for Connor, Cross (MRN [id number]) as of 08/13/2018 09:32  Ref. Range 08/10/2018 02:31 08/11/2018 02:45 08/12/2018 01:07 08/13/2018 07:28  WBC Latest Ref Range: 4.0 - 10.5 K/uL 12.9 (H) 12.4 (H) 10.5 9.5  RBC Latest Ref Range: 4.22 - 5.81 MIL/uL 4.15 (L) 3.85 (L) 3.71 (L) 4.02 (L)  Hemoglobin Latest Ref Range: 13.0 - 17.0 g/dL 12.2 (L) 11.5 (L) 11.2 (L) 11.8 (L)  HCT Latest Ref Range: 39.0 - 52.0 % 37.0 (L) 34.8 (L) 33.4 (L) 36.8 (L)  MCV Latest Ref Range: 80.0 - 100.0 fL 89.2 90.4 90.0 91.5  MCH Latest Ref Range: 26.0 - 34.0 pg 29.4 29.9 30.2 29.4  MCHC Latest Ref Range: 30.0 - 36.0 g/dL 33.0 33.0 33.5 32.1  RDW Latest Ref Range: 11.5 - 15.5 % 11.9 12.0 11.9 11.9  Platelets Latest Ref Range: 150 - 400 K/uL 212 202 197 233  nRBC Latest Ref Range: 0.0 - 0.2 % 0.0 0.0 0.0 0.0  Neutrophils Latest Units: %  61 66 62  Lymphocytes Latest Units: %  21 17 20   Monocytes Relative Latest Units: %  15 14 15   Eosinophil Latest Units: %  2 2 3   Basophil Latest Units: %  1 0 0  Immature Granulocytes Latest Units: %  0 1 0  NEUT# Latest Ref Range: 1.7 - 7.7 K/uL  7.6 7.0 5.9  Lymphocyte # Latest Ref Range: 0.7 - 4.0 K/uL  2.6 1.8 1.9  Monocyte # Latest Ref Range: 0.1 - 1.0 K/uL  1.8 (H) 1.5 (H) 1.4 (H)  Eosinophils Absolute Latest Ref Range: 0.0 - 0.5 K/uL  0.3 0.2 0.3  Basophils Absolute Latest Ref Range: 0.0 - 0.1 K/uL  0.1 0.0 0.0  Abs Immature Granulocytes  Latest Ref Range: 0.00 - 0.07 K/uL  0.05 0.06 0.03   Results for Connor, Cross (MRN [id number]) as of 08/13/2018 09:32  Ref. Range 08/10/2018 09:36 08/11/2018 02:45 08/12/2018 01:07 08/13/2018 00:86  BASIC METABOLIC PANEL Unknown  Rpt (A) Rpt (A) Rpt (A)  Sodium Latest Ref Range: 135 - 145 mmol/L  133 (L) 132 (L) 135  Potassium Latest Ref Range: 3.5 - 5.1 mmol/L  4.0 4.1 4.0  Chloride Latest Ref Range: 98 - 111 mmol/L  102 101 104  CO2 Latest Ref Range: 22 - 32 mmol/L  25 23 25   Glucose Latest Ref Range: 70 -  99 mg/dL  167 (H) 163 (H) 136 (H)  BUN Latest Ref Range: 8 - 23 mg/dL  13 14 11   Creatinine Latest Ref Range: 0.61 - 1.24 mg/dL  0.98 0.96 1.08  Calcium Latest Ref Range: 8.9 - 10.3 mg/dL  8.7 (L) 8.7 (L) 8.7 (L)  Anion gap Latest Ref Range: 5 - 15   6 8 6   GFR, Est Non African American Latest Ref Range: >60 mL/min  >60 >60 >60  GFR, Est African American Latest Ref Range: >60 mL/min  >60 >60 >60  Troponin I Latest Ref Range: <0.03 ng/mL 33.43 (HH)     Total CHOL/HDL Ratio Latest Units: RATIO    5.1  Cholesterol Latest Ref Range: 0 - 200 mg/dL    142  HDL Cholesterol Latest Ref Range: >40 mg/dL    28 (L)  LDL (calc) Latest Ref Range: 0 - 99 mg/dL    100 (H)  Triglycerides Latest Ref Range: <150 mg/dL    68  VLDL Latest Ref Range: 0 - 40 mg/dL    14    Hospital echocardiogram 08/09/2018: Study Conclusions  - Left ventricle: The cavity size was normal. There was mild   concentric hypertrophy. Systolic function was normal. The   estimated ejection fraction was in the range of 55% to 60%.   Hypokinesis of the inferior myocardium. Doppler parameters are   consistent with abnormal left ventricular relaxation (grade 1   diastolic dysfunction). - Aortic valve: Mildly calcified annulus. Moderately calcified   leaflets. There was mild to moderate stenosis. Vmax 1.9 m/sec,   mean PG 8 mmHg, AVA 1.3 cm2. AVAi 0.6 cm2. Mean gradient appears   discordant with 2D appearance and AVA calculation by VTI method. - No other significant valvular abnormality.   Treatments:  LM: Distal calcific 30% stenosis LAD: Mid LAD 50% stenosis          Diag 1 95% stenosis--->Successful PTCA and stent placement          Resolute 2.5 X 22 mm DES Diag1  LCx: Ostial 40% calcific stenosis Mid LCx 95% stenosis--->Successful PTCA and stent placement          Resolute 3.0 X 15 mm DES mid LCx Distal LCx/OM bifurcation 60% stenosis RCA: Prox 20% diffuse disease Mid 80%  stenosis--->Successful PTCA and stent placement           Resolute 3.5 X 18 mm DES mid RCA Distal diffuse 40-50% disease Mid PDA 60% stenosis at branching point of septal perforators  Residual mild to moderate disease to be treated medically Needs aggressive risk factor modification, especially diabetes and blood pressure control.   Recommend uninterrupted dual antiplatelet therapy with Aspirin 81mg  daily and Prasugrel 10mg  daily for a minimum of 12 months (ACS - Class I recommendation).   Discharge Exam: Blood pressure (!) 144/73, pulse 72, temperature 98.3 F (36.8  C), temperature source Oral, resp. rate (!) 21, height 6\' 1"  (1.854 m), weight 88 kg, SpO2 97 %. Constitutional: He isoriented to person, place, and time. He appearswell-developedand well-nourished.No distress.  HENT:  Head:Normocephalicand atraumatic.  Eyes:Pupils are equal, round, and reactive to light.Conjunctivaeare normal.  Neck:Normal range of motion.Neck supple.No JVDpresent.  Cardiovascular:Normal rate,regular rhythmand intact distal pulses. Murmurheard. Crescendosystolic(Aortic area)murmur is present with a grade of 2/6. TR band placed on right wrist Pulmonary/Chest:Effort normal. He hasno wheezes. He hasno rales  Abdominal:Soft.Bowel sounds are normal. There isno tenderness. There isno rebound.  Musculoskeletal:Normal range of motion. He exhibits no edema  Lymphadenopathy:  He has no cervical adenopathy.  Neurological: He isalertand oriented to person, place, and time. Nocranial nerve deficit.  Skin: Skin iswarmand dry.  Psychiatric: He has anormal mood and affect. Nursing noteand vitalsreviewed.  Disposition: Discharge disposition: 01-Home or Self Care       Discharge Instructions    AMB Referral to Cardiac Rehabilitation - Phase II   Complete by:  As directed    Diagnosis:   Coronary Stents NSTEMI     Amb Referral to Cardiac  Rehabilitation   Complete by:  As directed    Diagnosis:   NSTEMI Coronary Stents     Diet - low sodium heart healthy   Complete by:  As directed    Diet - low sodium heart healthy   Complete by:  As directed    Hold Metformin X 48 hours post IV contrast   Complete by:  As directed    Increase activity slowly   Complete by:  As directed    Increase activity slowly   Complete by:  As directed      Allergies as of 08/13/2018   No Known Allergies     Medication List    STOP taking these medications   aspirin 81 MG tablet Replaced by:  aspirin 81 MG chewable tablet     TAKE these medications   aspirin 81 MG chewable tablet Chew 1 tablet (81 mg total) by mouth daily. Replaces:  aspirin 81 MG tablet   atorvastatin 80 MG tablet Commonly known as:  LIPITOR Take 1 tablet (80 mg total) by mouth daily at 6 PM.   carvedilol 6.25 MG tablet Commonly known as:  COREG Take 1 tablet (6.25 mg total) by mouth 2 (two) times daily with a meal.   empagliflozin 10 MG Tabs tablet Commonly known as:  JARDIANCE Take 10 mg by mouth daily.   glucose blood test strip Use as instructed   lisinopril 10 MG tablet Commonly known as:  PRINIVIL,ZESTRIL Take 1 tablet (10 mg total) by mouth daily.   metFORMIN 750 MG 24 hr tablet Commonly known as:  GLUCOPHAGE-XR Take 1 tablet (750 mg total) by mouth daily with breakfast. Start on 08/16/2018   nitroGLYCERIN 0.4 MG SL tablet Commonly known as:  NITROSTAT Place 1 tablet (0.4 mg total) under the tongue every 5 (five) minutes x 3 doses as needed for chest pain.   prasugrel 10 MG Tabs tablet Commonly known as:  EFFIENT Take 1 tablet (10 mg total) by mouth daily.      Follow-up Information    Nigel Mormon, MD Follow up on 08/22/2018.   Specialty:  Cardiology Why:  11:45 AM Contact information: [place] [address] [phone]           Signed: Nigel Mormon 08/13/2018, 9:28 AM   Nigel Mormon, MD [facility] Pager: [phone]  Office: [phone] If no answer Cell [phone]

## 2018-08-13 NOTE — Progress Notes (Signed)
CARDIAC REHAB PHASE I   PRE:  Rate/Rhythm: 74 SR  BP:  Supine: 144/73  Sitting:   Standing:    SaO2:   MODE:  Ambulation: 550 ft   POST:  Rate/Rhythm: 94 SR  BP:  Supine: 138/61  Sitting:   Standing:    SaO2:  0755-0845 Pt walked 550 ft on RA with steady gait and no CP. Tolerated well. MI education completed with pt who voiced understanding. Stressed importance of taking all meds especially effient. Pt stated he has learned a lesson and will take his meds. Reviewed NTG use, MI restrictions, ex ed, gave diabetic and heart healthy diets. Discussed watching carbs and need to get A!C down.  Discussed CRP 2 and referred to [place]. Pt seems determined to make changes.   Graylon Good, RN BSN  08/13/2018 8:42 AM

## 2018-08-15 MED FILL — Nitroglycerin IV Soln 100 MCG/ML in D5W: INTRA_ARTERIAL | Qty: 10 | Status: AC

## 2018-08-21 ENCOUNTER — Telehealth (HOSPITAL_COMMUNITY): Payer: Self-pay

## 2018-08-21 NOTE — Telephone Encounter (Signed)
Attempted to call patient in regards to Cardiac Rehab - LM on VM 

## 2018-08-21 NOTE — Telephone Encounter (Signed)
Pt insurance is active and benefits verified through Medicare A/B. Co-pay $0.00, DED $185.00/$185.00 met, out of pocket $0.00/$0.00 met, co-insurance 20%. No pre-authorization required. [employer]port, 08/21/18 @ 9:21AM, REF# [id number]  Will contact patient to see if he is interested in the Cardiac Rehab Program. If interested, patient will need to complete follow up appt. Once completed, patient will be contacted for scheduling upon review by the RN Navigator.

## 2018-09-02 NOTE — Telephone Encounter (Signed)
Attempted to call patient in regards to Cardiac Rehab - LM on VM 

## 2018-09-16 ENCOUNTER — Encounter (HOSPITAL_COMMUNITY): Payer: Self-pay

## 2018-09-16 NOTE — Telephone Encounter (Signed)
Attempted to call pt a 2nd time- LM ON VM ° °Mailed letter out °

## 2018-10-06 ENCOUNTER — Telehealth (HOSPITAL_COMMUNITY): Payer: Self-pay

## 2018-10-06 NOTE — Telephone Encounter (Signed)
3rd attempt to call patient in regards to Cardiac Rehab - lm on vm

## 2018-10-22 ENCOUNTER — Telehealth (HOSPITAL_COMMUNITY): Payer: Self-pay

## 2018-10-22 NOTE — Telephone Encounter (Signed)
No response from pt.  Closed referral  

## 2018-11-20 NOTE — Progress Notes (Signed)
Patient is here for follow up visit.  Subjective:   Connor Cross, male    DOB: 1941-08-10, 78 y.o.   MRN: [id number]   Chief Complaint  Patient presents with  . Coronary Artery Disease    f/u 3 months    HPI  78 y/o AAM w/hypertension, uncontrolled type 2 DM, NSTEMI 07/2018, s/p multivessel PCI, h/o prostate cancer.  Patient is here with his wife today.  He is doing very well and denies chest pain, shortness of breath, palpitations, leg edema, orthopnea, PND, TIA/syncope.  He has been playing golf 3 times a week, where he walks between the holes, without any difficulty.  While he denies claudication, he reports that his feet stay cold.    He has been compliant with his medical therapy.  Blood pressure is usually less than 130/80 mmHg.  His diabetes is managed by his PCP.  While Jardiance is expensive, he is able to continue compliance. His Metformin dose was recently increased by PCP.  He recently underwent blood work with PCP, but unsure if lipid panel was checked.  Past Medical History:  Diagnosis Date  . Arthritis   . Diabetes mellitus ([place])   . Prostate cancer ([place]) 2003     Past Surgical History:  Procedure Laterality Date  . CORONARY STENT INTERVENTION N/A 08/12/2018   Procedure: CORONARY STENT INTERVENTION;  Surgeon: Nigel Mormon, MD;  Location: [facility];  Service: Cardiovascular;  Laterality: N/A;  . LEFT HEART CATH AND CORONARY ANGIOGRAPHY N/A 08/11/2018   Procedure: LEFT HEART CATH AND CORONARY ANGIOGRAPHY;  Surgeon: Nigel Mormon, MD;  Location: [facility];  Service: Cardiovascular;  Laterality: N/A;  . seed implants    . TONSILLECTOMY  age 67     Social History   Socioeconomic History  . Marital status: Single    Spouse name: Not on file  . Number of children: Not on file  . Years of education: Not on file  . Highest education level: Not on file  Occupational History  . Not on file  Social Needs  . Financial resource  strain: Not on file  . Food insecurity:    Worry: Not on file    Inability: Not on file  . Transportation needs:    Medical: Not on file    Non-medical: Not on file  Tobacco Use  . Smoking status: Former Smoker    Last attempt to quit: 09/17/2004    Years since quitting: 14.1  . Smokeless tobacco: Never Used  Substance and Sexual Activity  . Alcohol use: Yes    Alcohol/week: 3.0 standard drinks    Types: 3 Shots of liquor per week    Comment: weekends  . Drug use: No  . Sexual activity: Not on file  Lifestyle  . Physical activity:    Days per week: Not on file    Minutes per session: Not on file  . Stress: Not on file  Relationships  . Social connections:    Talks on phone: Not on file    Gets together: Not on file    Attends religious service: Not on file    Active member of club or organization: Not on file    Attends meetings of clubs or organizations: Not on file    Relationship status: Not on file  . Intimate partner violence:    Fear of current or ex partner: Not on file    Emotionally abused: Not on file  Physically abused: Not on file    Forced sexual activity: Not on file  Other Topics Concern  . Not on file  Social History Narrative  . Not on file     Current Outpatient Medications on File Prior to Visit  Medication Sig Dispense Refill  . empagliflozin (JARDIANCE) 10 MG TABS tablet Take 10 mg by mouth daily. 30 tablet 3  . glucose blood (TRUETRACK TEST) test strip Use as instructed 100 each 2  . metFORMIN (GLUCOPHAGE) 1000 MG tablet Take 1,000 mg by mouth 2 (two) times daily with a meal.     No current facility-administered medications on file prior to visit.     Cardiovascular studies:  EKG 11/24/2018: Sinus rhythm 78 bpm. Left axis -anterior fascicular block.  Early R wave transition. Possible old posterior infarct.  Cath 08/12/2018: LM: Distal calcific 30% stenosis LAD: Mid LAD 50% stenosis          Diag 1 95% stenosis--->Successful PTCA and  stent placement          Resolute 2.5 X 22 mm DES Diag1  LCx: Ostial 40% calcific stenosis          Mid LCx 95% stenosis--->Successful PTCA and stent placement          Resolute 3.0 X 15 mm DES mid LCx          Distal LCx/OM bifurcation 60% stenosis RCA: Prox 20% diffuse disease           Mid 80% stenosis--->Successful PTCA and stent placement           Resolute 3.5 X 18 mm DES mid RCA           Distal diffuse 40-50% disease           Mid PDA 60% stenosis at branching point of septal perforators   Residual mild to moderate disease to be treated medically Needs aggressive risk factor modification, especially diabetes and blood pressure control.     Recommend uninterrupted dual antiplatelet therapy with Aspirin 81mg  daily and Prasugrel 10mg  daily for a minimum of 12 months (ACS - Class I recommendation).   Hospital echocardiogram 08/09/2018: - Left ventricle: The cavity size was normal. There was mild   concentric hypertrophy. Systolic function was normal. The   estimated ejection fraction was in the range of 55% to 60%.   Hypokinesis of the inferior myocardium. Doppler parameters are   consistent with abnormal left ventricular relaxation (grade 1   diastolic dysfunction). - Aortic valve: Mildly calcified annulus. Moderately calcified   leaflets. There was mild to moderate stenosis. Vmax 1.9 m/sec,   mean PG 8 mmHg, AVA 1.3 cm2. AVAi 0.6 cm2. Mean gradient appears   discordant with 2D appearance and AVA calculation by VTI method. - No other significant valvular abnormality.  ABI 12/27/2017: This exam reveals mildly decreased perfusion of the lower extremity, noted at the dorsalis pedis and post tibial artery level (ABI 0.87 right and 0.83 left). There is monophasic waveform right AT with diffuse plaque. Triphasic waveform left ankle.    Review of Systems  Constitution: Negative for decreased appetite, malaise/fatigue, weight gain and weight loss.  HENT: Negative for congestion.     Eyes: Negative for visual disturbance.  Cardiovascular: Negative for chest pain, dyspnea on exertion, leg swelling, palpitations and syncope.  Respiratory: Negative for shortness of breath.   Endocrine: Negative for cold intolerance.  Hematologic/Lymphatic: Does not bruise/bleed easily.  Skin: Negative for itching and rash.  Musculoskeletal: Negative for myalgias.  Gastrointestinal: Negative for abdominal pain, nausea and vomiting.  Genitourinary: Negative for dysuria.  Neurological: Negative for dizziness and weakness.  Psychiatric/Behavioral: The patient is not nervous/anxious.   All other systems reviewed and are negative.      Objective:    Vitals:   11/24/18 1340  BP: 140/72  Pulse: 92  SpO2: 96%     Physical Exam  Constitutional: He is oriented to person, place, and time. He appears well-developed and well-nourished. No distress.  HENT:  Head: Normocephalic and atraumatic.  Eyes: Pupils are equal, round, and reactive to light. Conjunctivae are normal.  Neck: No JVD present.  Cardiovascular: Normal rate and regular rhythm.  No murmur heard. Pulses:      Dorsalis pedis pulses are 2+ on the right side and 2+ on the left side.       Posterior tibial pulses are 1+ on the right side and 1+ on the left side.  Pulmonary/Chest: Effort normal and breath sounds normal. He has no wheezes. He has no rales.  Abdominal: Soft. Bowel sounds are normal. There is no rebound.  Musculoskeletal:        General: No edema.  Lymphadenopathy:    He has no cervical adenopathy.  Neurological: He is alert and oriented to person, place, and time. No cranial nerve deficit.  Skin: Skin is warm and dry.  Psychiatric: He has a normal mood and affect.  Nursing note and vitals reviewed.       Assessment & Recommendations:   78 y/o AAM w/hypertension, uncontrolled type 2 DM, NSTEMI 07/2018, s/p multivessel PCI, h/o prostate cancer.  1. Coronary artery disease involving native coronary artery  of native heart without angina pectoris Continue dual antiplatelet therapy with aspirin and Effient, at least till 08/2019.  After that, will consider maintenance Plavix for longer duration given his complex coronary artery disease and multivessel PCI.  Continue carvedilol 6.25 mg twice daily, lisinopril 10 mg daily. Continue aggressive management of diabetes, follow-up with PCP.  I gave him samples of Jardiance today.  2. Mild aortic stenosis Repeat echocardiogram in 03/2020.  3.  Mixed hyperlipidemia: Continue Lipitor 80 mg daily.  Check lipid panel in 3 months before next visit.   4. Abnormal ankle brachial index (ABI) No significant claudication symptoms.  Continue aggressive medical therapy.  I have encouraged him to walk regularly.  No indication for angiogram or revascularization at this time.  I refilled his cardiac medications today.  Nigel Mormon, MD [facility] Pager: [phone] Office: [phone] If no answer Cell [phone]

## 2018-11-24 ENCOUNTER — Ambulatory Visit (INDEPENDENT_AMBULATORY_CARE_PROVIDER_SITE_OTHER): Payer: Medicare Other | Admitting: Cardiology

## 2018-11-24 ENCOUNTER — Encounter: Payer: Self-pay | Admitting: Cardiology

## 2018-11-24 VITALS — BP 140/72 | HR 92 | Ht 73.0 in | Wt 193.0 lb

## 2018-11-24 DIAGNOSIS — E782 Mixed hyperlipidemia: Secondary | ICD-10-CM

## 2018-11-24 DIAGNOSIS — I350 Nonrheumatic aortic (valve) stenosis: Secondary | ICD-10-CM

## 2018-11-24 DIAGNOSIS — R6889 Other general symptoms and signs: Secondary | ICD-10-CM | POA: Diagnosis not present

## 2018-11-24 DIAGNOSIS — I2510 Atherosclerotic heart disease of native coronary artery without angina pectoris: Secondary | ICD-10-CM | POA: Insufficient documentation

## 2018-11-24 DIAGNOSIS — I25118 Atherosclerotic heart disease of native coronary artery with other forms of angina pectoris: Secondary | ICD-10-CM | POA: Insufficient documentation

## 2018-11-24 MED ORDER — NITROGLYCERIN 0.4 MG SL SUBL: 0.4000 mg | SUBLINGUAL_TABLET | SUBLINGUAL | 1 refills | Status: DC | PRN

## 2018-11-24 MED ORDER — CARVEDILOL 6.25 MG PO TABS: 6.2500 mg | ORAL_TABLET | Freq: Two times a day (BID) | ORAL | 2 refills | Status: DC

## 2018-11-24 MED ORDER — PRASUGREL HCL 10 MG PO TABS: 10.0000 mg | ORAL_TABLET | Freq: Every day | ORAL | 2 refills | Status: DC

## 2018-11-24 MED ORDER — ATORVASTATIN CALCIUM 80 MG PO TABS: 80.0000 mg | ORAL_TABLET | Freq: Every day | ORAL | 2 refills | Status: AC

## 2018-11-24 MED ORDER — ASPIRIN 81 MG PO CHEW: 81.0000 mg | CHEWABLE_TABLET | Freq: Every day | ORAL | 2 refills | Status: AC

## 2018-11-24 MED ORDER — LISINOPRIL 10 MG PO TABS: 10.0000 mg | ORAL_TABLET | Freq: Every day | ORAL | 2 refills | Status: DC

## 2019-02-24 ENCOUNTER — Ambulatory Visit (INDEPENDENT_AMBULATORY_CARE_PROVIDER_SITE_OTHER): Payer: Medicare Other

## 2019-02-24 ENCOUNTER — Other Ambulatory Visit: Payer: Medicare Other

## 2019-02-24 ENCOUNTER — Other Ambulatory Visit: Payer: Self-pay

## 2019-02-24 DIAGNOSIS — I350 Nonrheumatic aortic (valve) stenosis: Secondary | ICD-10-CM | POA: Diagnosis not present

## 2019-03-04 NOTE — Progress Notes (Signed)
Patient is here for follow up visit.  Subjective:   Connor Cross, male    DOB: 10/04/1940, 78 y.o.   MRN: [id number]  I connected with the patient on 03/05/2019 by a telephone call and verified that I am speaking with the correct person using two identifiers.     I offered the patient a video enabled application for a virtual visit. Unfortunately, this could not be accomplished due to technical difficulties/lack of video enabled phone/computer. I discussed the limitations of evaluation and management by telemedicine and the availability of in person appointments. The patient expressed understanding and agreed to proceed.   This visit type was conducted due to national recommendations for restrictions regarding the COVID-19 Pandemic (e.g. social distancing).  This format is felt to be most appropriate for this patient at this time.  All issues noted in this document were discussed and addressed.  No physical exam was performed (except for noted visual exam findings with Tele health visits).  The patient has consented to conduct a Tele health visit and understands insurance will be billed.   Chief Complaint  Patient presents with  . Coronary Artery Disease    HPI  78 y/o AAM w/hypertension, uncontrolled type 2 DM, CAD s/p multivessel PCI for NSTEMI (07/2018), mild aortic stenosis,  h/o prostate cancer.  Recent echocardiogram discussed, no significant change noted. Patient denies chest pain, shortness of breath, palpitations, leg edema, orthopnea, PND, TIA/syncope. He is playing golf 3 times/week without any difficulty. Diabetes is well controlled as well. He recently saw his PCP, where BP was checked. He does not recollect the readings.   Past Medical History:  Diagnosis Date  . Arthritis   . Diabetes mellitus ([place])   . Hypertension   . Prostate cancer ([place]) 2003     Past Surgical History:  Procedure Laterality Date  . CORONARY STENT INTERVENTION N/A 08/12/2018   Procedure:  CORONARY STENT INTERVENTION;  Surgeon: Nigel Mormon, MD;  Location: [facility];  Service: Cardiovascular;  Laterality: N/A;  . LEFT HEART CATH AND CORONARY ANGIOGRAPHY N/A 08/11/2018   Procedure: LEFT HEART CATH AND CORONARY ANGIOGRAPHY;  Surgeon: Nigel Mormon, MD;  Location: [facility];  Service: Cardiovascular;  Laterality: N/A;  . seed implants    . TONSILLECTOMY  age 30     Social History   Socioeconomic History  . Marital status: Single    Spouse name: Not on file  . Number of children: Not on file  . Years of education: Not on file  . Highest education level: Not on file  Occupational History  . Not on file  Social Needs  . Financial resource strain: Not on file  . Food insecurity    Worry: Not on file    Inability: Not on file  . Transportation needs    Medical: Not on file    Non-medical: Not on file  Tobacco Use  . Smoking status: Former Smoker    Quit date: 09/17/2004    Years since quitting: 14.4  . Smokeless tobacco: Never Used  Substance and Sexual Activity  . Alcohol use: Yes    Alcohol/week: 3.0 standard drinks    Types: 3 Shots of liquor per week    Comment: weekends  . Drug use: No  . Sexual activity: Not on file  Lifestyle  . Physical activity    Days per week: Not on file    Minutes per session: Not on file  . Stress: Not  on file  Relationships  . Social Herbalist on phone: Not on file    Gets together: Not on file    Attends religious service: Not on file    Active member of club or organization: Not on file    Attends meetings of clubs or organizations: Not on file    Relationship status: Not on file  . Intimate partner violence    Fear of current or ex partner: Not on file    Emotionally abused: Not on file    Physically abused: Not on file    Forced sexual activity: Not on file  Other Topics Concern  . Not on file  Social History Narrative  . Not on file     Current Outpatient Medications on  File Prior to Visit  Medication Sig Dispense Refill  . aspirin (ASPIRIN CHILDRENS) 81 MG chewable tablet Chew 1 tablet (81 mg total) by mouth daily. 90 tablet 2  . atorvastatin (LIPITOR) 80 MG tablet Take 1 tablet (80 mg total) by mouth daily at 6 PM. 90 tablet 2  . carvedilol (COREG) 6.25 MG tablet Take 1 tablet (6.25 mg total) by mouth 2 (two) times daily with a meal. 90 tablet 2  . empagliflozin (JARDIANCE) 10 MG TABS tablet Take 10 mg by mouth daily. 30 tablet 3  . glucose blood (TRUETRACK TEST) test strip Use as instructed 100 each 2  . lisinopril (PRINIVIL,ZESTRIL) 10 MG tablet Take 1 tablet (10 mg total) by mouth daily. 90 tablet 2  . metFORMIN (GLUCOPHAGE) 1000 MG tablet Take 1,000 mg by mouth 2 (two) times daily with a meal.    . nitroGLYCERIN (NITROSTAT) 0.4 MG SL tablet Place 1 tablet (0.4 mg total) under the tongue every 5 (five) minutes x 3 doses as needed for chest pain. 30 tablet 1  . prasugrel (EFFIENT) 10 MG TABS tablet Take 1 tablet (10 mg total) by mouth daily. 30 tablet 2   No current facility-administered medications on file prior to visit.     Cardiovascular studies:    Echocardiogram 02/24/2019: Normal LV systolic function with EF 57%. Left ventricle cavity is normal in size. Moderate concentric hypertrophy of the left ventricle. Normal global wall motion. Normal diastolic filling pattern. Calculated EF 57%. Mild aortic valve stenosis. No regurgitation noted. Aortic valve mean gradient of 9 mmHg, Vmax of 2.0  m/s. Calculated aortic valve area by continuity equation is 1.6 cm. Inadequate TR jet to estimate pulmonary artery systolic pressure. Normal right atrial pressure. No significant change compared to previous study on 08/09/2018.      EKG 11/24/2018: Sinus rhythm 78 bpm. Left axis -anterior fascicular block.  Early R wave transition. Possible old posterior infarct.  Cath 08/12/2018: LM: Distal calcific 30% stenosis LAD: Mid LAD 50% stenosis          Diag 1  95% stenosis--->Successful PTCA and stent placement          Resolute 2.5 X 22 mm DES Diag1  LCx: Ostial 40% calcific stenosis          Mid LCx 95% stenosis--->Successful PTCA and stent placement          Resolute 3.0 X 15 mm DES mid LCx          Distal LCx/OM bifurcation 60% stenosis RCA: Prox 20% diffuse disease           Mid 80% stenosis--->Successful PTCA and stent placement           Resolute 3.5  X 18 mm DES mid RCA           Distal diffuse 40-50% disease           Mid PDA 60% stenosis at branching point of septal perforators   Residual mild to moderate disease to be treated medically Needs aggressive risk factor modification, especially diabetes and blood pressure control.     Recommend uninterrupted dual antiplatelet therapy with Aspirin 81mg  daily and Prasugrel 10mg  daily for a minimum of 12 months (ACS - Class I recommendation).   Hospital echocardiogram 08/09/2018: - Left ventricle: The cavity size was normal. There was mild   concentric hypertrophy. Systolic function was normal. The   estimated ejection fraction was in the range of 55% to 60%.   Hypokinesis of the inferior myocardium. Doppler parameters are   consistent with abnormal left ventricular relaxation (grade 1   diastolic dysfunction). - Aortic valve: Mildly calcified annulus. Moderately calcified   leaflets. There was mild to moderate stenosis. Vmax 1.9 m/sec,   mean PG 8 mmHg, AVA 1.3 cm2. AVAi 0.6 cm2. Mean gradient appears   discordant with 2D appearance and AVA calculation by VTI method. - No other significant valvular abnormality.  ABI 12/27/2017: This exam reveals mildly decreased perfusion of the lower extremity, noted at the dorsalis pedis and post tibial artery level (ABI 0.87 right and 0.83 left). There is monophasic waveform right AT with diffuse plaque. Triphasic waveform left ankle.    Review of Systems  Constitution: Negative for decreased appetite, malaise/fatigue, weight gain and weight  loss.  HENT: Negative for congestion.   Eyes: Negative for visual disturbance.  Cardiovascular: Negative for chest pain, dyspnea on exertion, leg swelling, palpitations and syncope.  Respiratory: Negative for shortness of breath.   Endocrine: Negative for cold intolerance.  Hematologic/Lymphatic: Does not bruise/bleed easily.  Skin: Negative for itching and rash.  Musculoskeletal: Negative for myalgias.  Gastrointestinal: Negative for abdominal pain, nausea and vomiting.  Genitourinary: Negative for dysuria.  Neurological: Negative for dizziness and weakness.  Psychiatric/Behavioral: The patient is not nervous/anxious.   All other systems reviewed and are negative.      Objective:   No vitals available.     Physical Exam Not performed. Telephone visit     Assessment & Recommendations:   78 y/o AAM w/hypertension, uncontrolled type 2 DM, NSTEMI 07/2018, s/p multivessel PCI, h/o prostate cancer.  1. Coronary artery disease involving native coronary artery of native heart without angina pectoris Continue dual antiplatelet therapy with aspirin and Effient, at least till 08/2019.  After that, will consider maintenance Plavix for longer duration given his complex coronary artery disease and multivessel PCI.  Continue carvedilol 6.25 mg twice daily, lisinopril 10 mg daily. Continue aggressive management of diabetes, follow-up with PCP.  I gave him samples of Jardiance today.  2. Mild aortic stenosis Repeat echocardiogram in 08/2020.  3.  Mixed hyperlipidemia: Continue Lipitor 80 mg daily.    4. Abnormal ankle brachial index (ABI) No significant claudication symptoms.  Continue aggressive medical therapy.  I have encouraged him to walk regularly.  No indication for angiogram or revascularization at this time.  F/u in 6 months w/lipid panel check.   Nigel Mormon, MD [facility] Pager: [phone] Office: [phone] If no answer Cell [phone]

## 2019-03-05 ENCOUNTER — Ambulatory Visit (INDEPENDENT_AMBULATORY_CARE_PROVIDER_SITE_OTHER): Payer: Medicare Other | Admitting: Cardiology

## 2019-03-05 ENCOUNTER — Other Ambulatory Visit: Payer: Self-pay

## 2019-03-05 ENCOUNTER — Encounter: Payer: Self-pay | Admitting: Cardiology

## 2019-03-05 DIAGNOSIS — I350 Nonrheumatic aortic (valve) stenosis: Secondary | ICD-10-CM

## 2019-03-05 DIAGNOSIS — E782 Mixed hyperlipidemia: Secondary | ICD-10-CM | POA: Diagnosis not present

## 2019-03-05 DIAGNOSIS — I2510 Atherosclerotic heart disease of native coronary artery without angina pectoris: Secondary | ICD-10-CM | POA: Diagnosis not present

## 2019-05-11 ENCOUNTER — Other Ambulatory Visit: Payer: Self-pay

## 2019-05-11 ENCOUNTER — Telehealth: Payer: Self-pay

## 2019-05-11 DIAGNOSIS — I2510 Atherosclerotic heart disease of native coronary artery without angina pectoris: Secondary | ICD-10-CM

## 2019-05-11 MED ORDER — PRASUGREL HCL 10 MG PO TABS: 10.0000 mg | ORAL_TABLET | Freq: Every day | ORAL | 2 refills | Status: DC

## 2019-05-11 MED ORDER — LISINOPRIL 10 MG PO TABS: 10.0000 mg | ORAL_TABLET | Freq: Every day | ORAL | 2 refills | Status: DC

## 2019-05-11 MED ORDER — CARVEDILOL 6.25 MG PO TABS: 6.2500 mg | ORAL_TABLET | Freq: Two times a day (BID) | ORAL | 2 refills | Status: DC

## 2019-05-11 NOTE — Telephone Encounter (Signed)
called for a refill. pt declined to schedule echo & f/u. Stated she will call back to schedule

## 2019-05-12 ENCOUNTER — Other Ambulatory Visit: Payer: Self-pay

## 2019-06-04 ENCOUNTER — Other Ambulatory Visit: Payer: Self-pay

## 2019-06-04 DIAGNOSIS — I2510 Atherosclerotic heart disease of native coronary artery without angina pectoris: Secondary | ICD-10-CM

## 2019-06-04 MED ORDER — PRASUGREL HCL 10 MG PO TABS: 10.0000 mg | ORAL_TABLET | Freq: Every day | ORAL | 0 refills | Status: DC

## 2019-12-27 IMAGING — DX DG CHEST 2V
2 series · 2 of 2 positions shown · non-contrast
Comparison: 01/31/2004

CLINICAL DATA: Left-sided chest pain

EXAM:
CHEST - 2 VIEW

[chest pa]
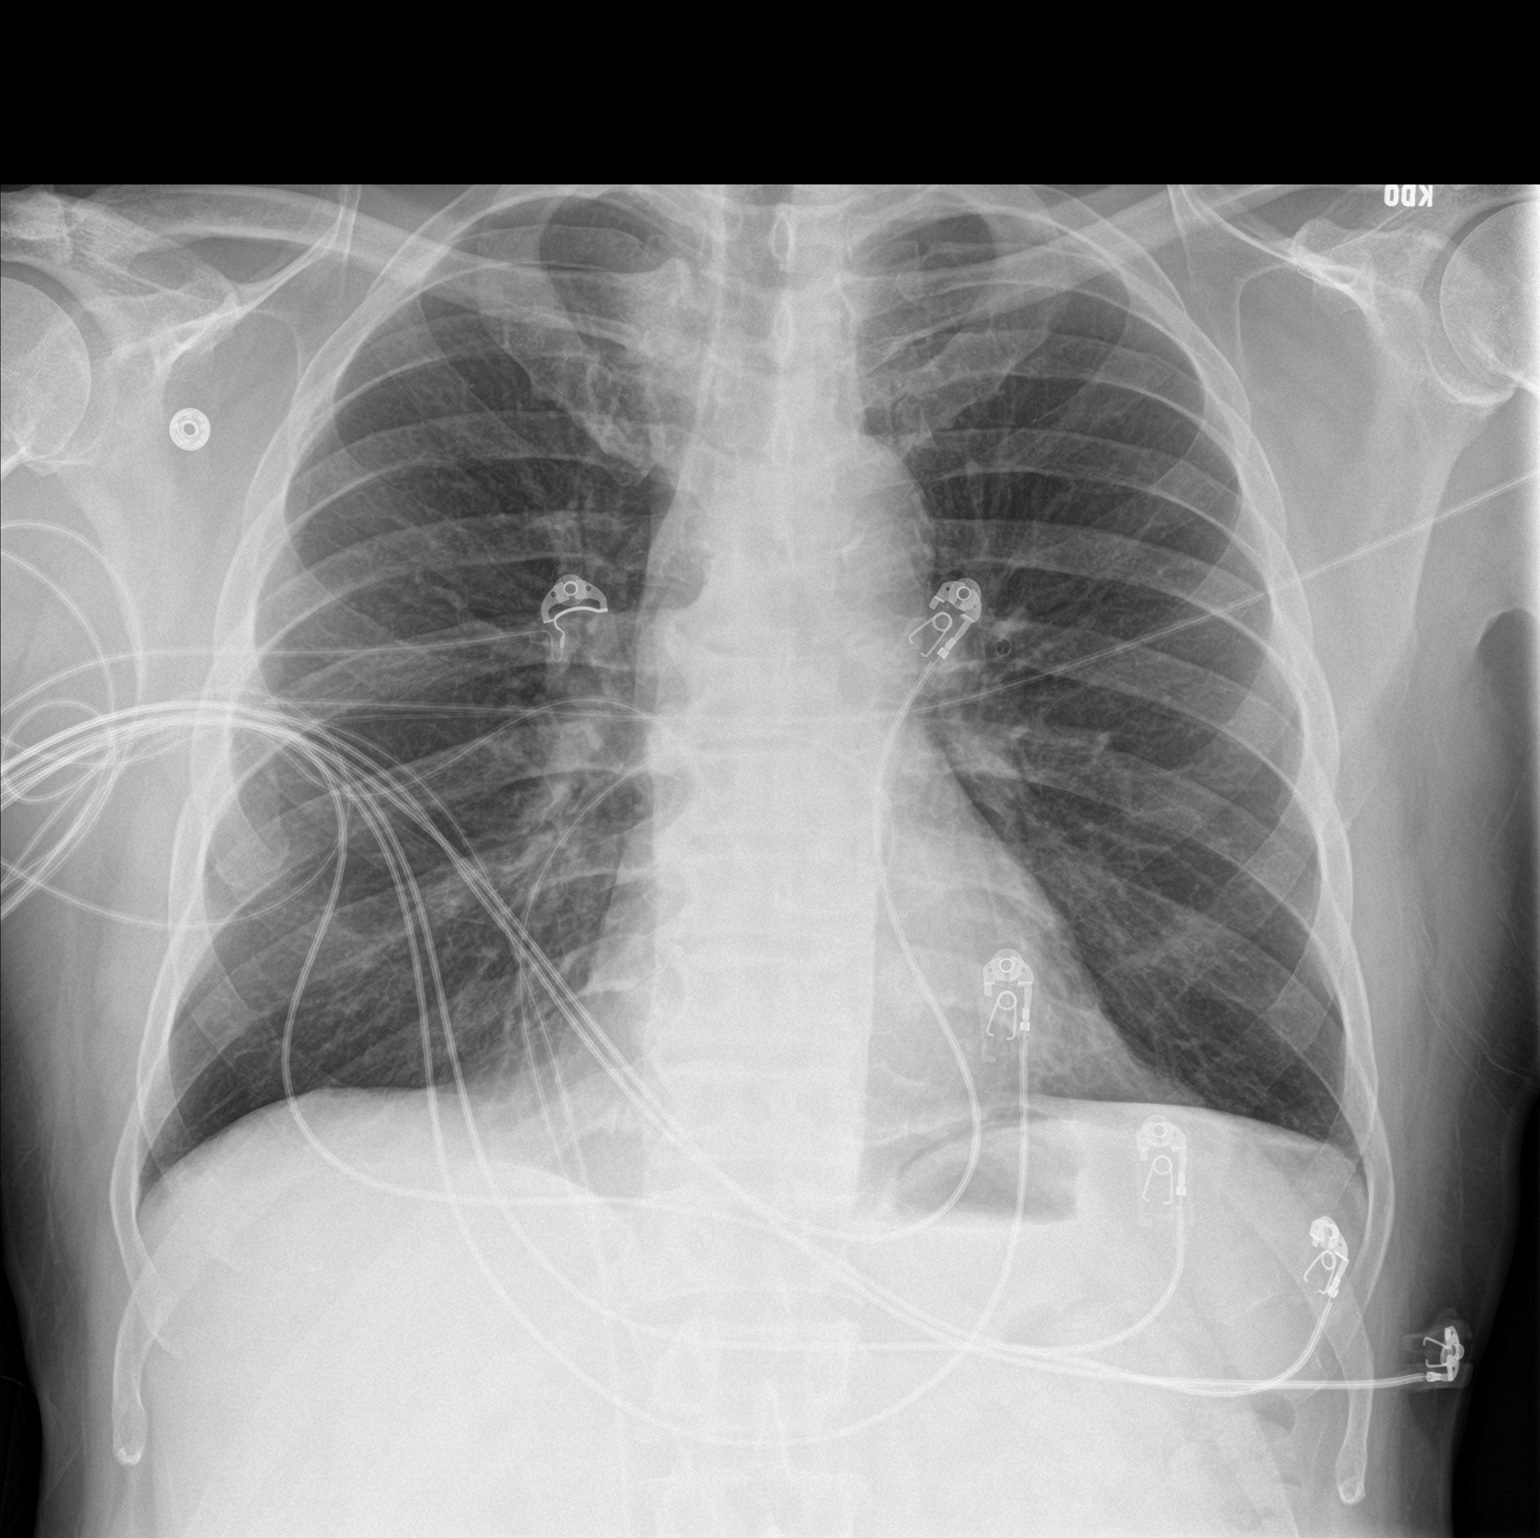

[chest lat]
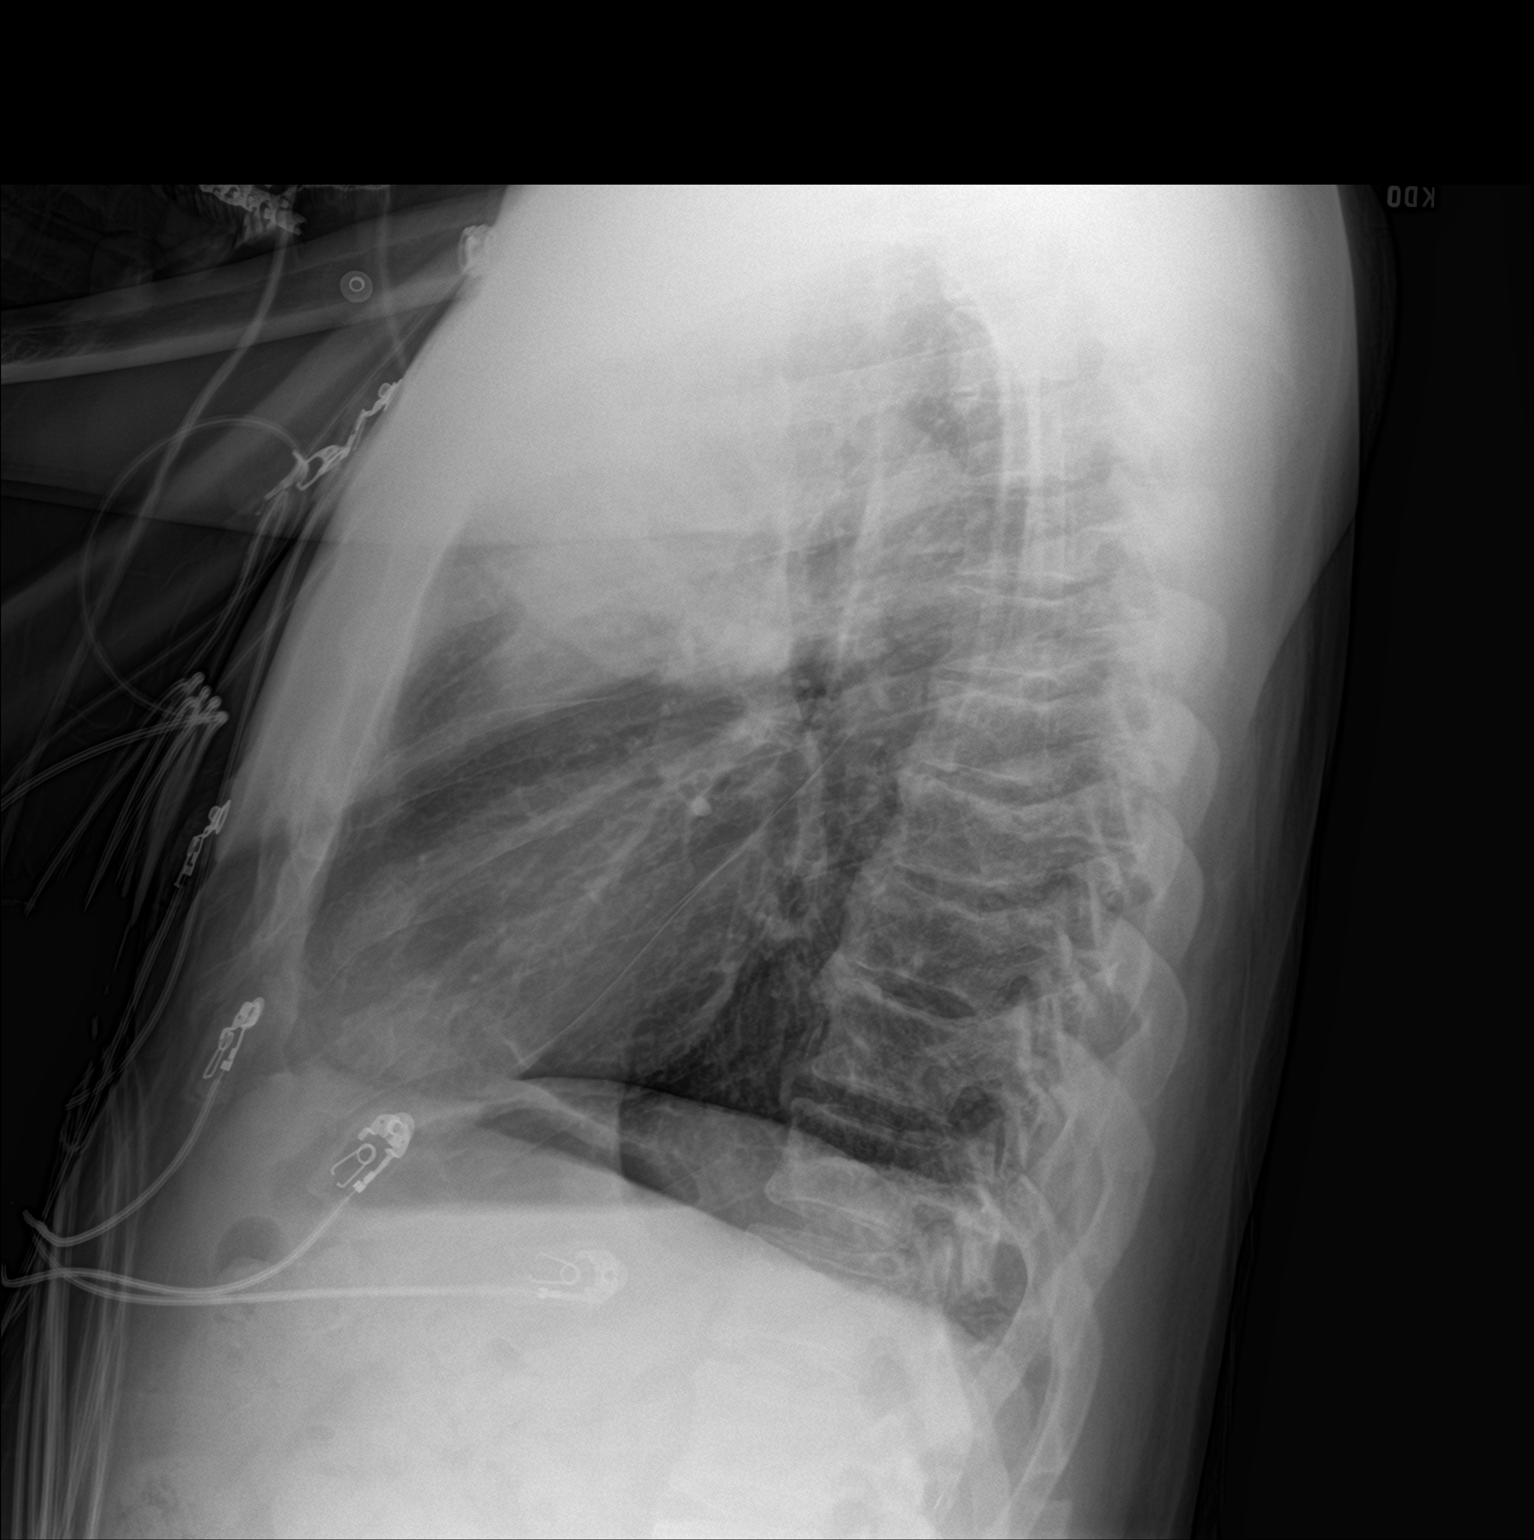

[2 of 2 positions shown; findings below may reference images not displayed]

FINDINGS: The heart size and mediastinal contours are within normal limits.
Both lungs are clear. The visualized skeletal structures are
unremarkable.
IMPRESSION: No active cardiopulmonary disease.

## 2020-10-31 ENCOUNTER — Other Ambulatory Visit: Payer: Self-pay | Admitting: Internal Medicine

## 2020-11-01 LAB — COMPLETE METABOLIC PANEL WITHOUT GFR
AG Ratio: 1.3 (calc) (ref 1.0–2.5)
ALT: 41 U/L (ref 9–46)
AST: 41 U/L — ABNORMAL HIGH (ref 10–35)
Albumin: 4 g/dL (ref 3.6–5.1)
Alkaline phosphatase (APISO): 92 U/L (ref 35–144)
BUN: 16 mg/dL (ref 7–25)
CO2: 25 mmol/L (ref 20–32)
Calcium: 9.1 mg/dL (ref 8.6–10.3)
Chloride: 104 mmol/L (ref 98–110)
Creat: 1.03 mg/dL (ref 0.70–1.11)
GFR, Est African American: 79 mL/min/1.73m2
GFR, Est Non African American: 68 mL/min/1.73m2
Globulin: 3.2 g/dL (ref 1.9–3.7)
Glucose, Bld: 137 mg/dL — ABNORMAL HIGH (ref 65–99)
Potassium: 4.1 mmol/L (ref 3.5–5.3)
Sodium: 138 mmol/L (ref 135–146)
Total Bilirubin: 0.7 mg/dL (ref 0.2–1.2)
Total Protein: 7.2 g/dL (ref 6.1–8.1)

## 2020-11-01 LAB — CBC
HCT: 41.7 % (ref 38.5–50.0)
Hemoglobin: 14.2 g/dL (ref 13.2–17.1)
MCH: 31.4 pg (ref 27.0–33.0)
MCHC: 34.1 g/dL (ref 32.0–36.0)
MCV: 92.3 fL (ref 80.0–100.0)
MPV: 11.4 fL (ref 7.5–12.5)
Platelets: 207 10*3/uL (ref 140–400)
RBC: 4.52 10*6/uL (ref 4.20–5.80)
RDW: 12.5 % (ref 11.0–15.0)
WBC: 9.1 10*3/uL (ref 3.8–10.8)

## 2020-11-01 LAB — LIPID PANEL
Cholesterol: 101 mg/dL (ref <200)
HDL: 33 mg/dL — ABNORMAL LOW (ref >=40)
LDL Cholesterol (Calc): 46 mg/dL (calc)
Non-HDL Cholesterol (Calc): 68 mg/dL (calc) (ref <130)
Total CHOL/HDL Ratio: 3.1 (calc) (ref <5.0)
Triglycerides: 132 mg/dL (ref <150)

## 2020-11-01 LAB — TSH: TSH: 1.5 m[IU]/L (ref 0.40–4.50)

## 2020-11-01 LAB — SARS-COV-2 RNA,(COVID-19) QUALITATIVE NAAT: SARS CoV2 RNA: DETECTED — AB

## 2021-01-19 ENCOUNTER — Ambulatory Visit: Payer: Medicare Other | Admitting: Cardiology

## 2021-02-23 ENCOUNTER — Ambulatory Visit: Payer: Medicare PPO | Admitting: Cardiology

## 2021-02-24 ENCOUNTER — Ambulatory Visit: Payer: Medicare PPO | Admitting: Cardiology

## 2021-02-24 NOTE — Progress Notes (Signed)
Patient is here for follow up visit.  Subjective:   Connor Cross, male    DOB: 1941/02/28, 80 y.o.   MRN: [id number]   Chief Complaint  Patient presents with   Chest Pain   Hypertension   Coronary Artery Disease   Follow-up    HPI  80 y/o African American male w/hypertension, uncontrolled type 2 DM, CAD s/p multivessel PCI for NSTEMI (07/2018), mild aortic stenosis,  h/o prostate cancer  Patient was last seen by me in 02/2019.  He had an episode of chest tightness 6 months ago. He works at a [employer]. He had lifted a load of 20 lbs earlier in the day. Following this he had lunch of hamburger. After his meal, he had left sided chest tightness that lasted for 3-4 hrs and resolved on its own.  Patient has had no recurrence of chest pain since then. He walks at work, walks a [place]. He denies chest pain, shortness of breath, palpitations, leg edema, orthopnea, PND, TIA/syncope.  Current Outpatient Medications on File Prior to Visit  Medication Sig Dispense Refill   atorvastatin (LIPITOR) 80 MG tablet Take 1 tablet (80 mg total) by mouth daily at 6 PM. 90 tablet 2   carvedilol (COREG) 6.25 MG tablet Take 1 tablet (6.25 mg total) by mouth 2 (two) times daily with a meal. 90 tablet 2   empagliflozin (JARDIANCE) 10 MG TABS tablet Take 10 mg by mouth daily. 30 tablet 3   glucose blood (TRUETRACK TEST) test strip Use as instructed 100 each 2   lisinopril (ZESTRIL) 10 MG tablet Take 1 tablet (10 mg total) by mouth daily. 90 tablet 2   metFORMIN (GLUCOPHAGE) 1000 MG tablet Take 1,000 mg by mouth 2 (two) times daily with a meal.     nitroGLYCERIN (NITROSTAT) 0.4 MG SL tablet Place 1 tablet (0.4 mg total) under the tongue every 5 (five) minutes x 3 doses as needed for chest pain. 30 tablet 1   prasugrel (EFFIENT) 10 MG TABS tablet Take 1 tablet (10 mg total) by mouth daily. 7 tablet 0   No current facility-administered medications on file prior to visit.    Cardiovascular  studies:  EKG 03/01/2021: Sinus rhythm 65 bpm Left anterior fascicular block Early R wave transition, unchanged from previous EKG  Echocardiogram 02/24/2019: Normal LV systolic function with EF 57%. Left ventricle cavity is normal in size. Moderate concentric hypertrophy of the left ventricle. Normal global wall motion. Normal diastolic filling pattern. Calculated EF 57%. Mild aortic valve stenosis. No regurgitation noted. Aortic valve mean gradient of 9 mmHg, Vmax of 2.0  m/s. Calculated aortic valve area by continuity equation is 1.6 cm. Inadequate TR jet to estimate pulmonary artery systolic pressure. Normal right atrial pressure. No significant change compared to previous study on 08/09/2018.   Coronary intervention 08/12/2018: LM: Distal calcific 30% stenosis LAD: Mid LAD 50% stenosis          Diag 1 95% stenosis--->Successful PTCA and stent placement          Resolute 2.5 X 22 mm DES Diag1  LCx: Ostial 40% calcific stenosis          Mid LCx 95% stenosis--->Successful PTCA and stent placement          Resolute 3.0 X 15 mm DES mid LCx          Distal LCx/OM bifurcation 60% stenosis RCA: Prox 20% diffuse disease  Mid 80% stenosis--->Successful PTCA and stent placement           Resolute 3.5 X 18 mm DES mid RCA           Distal diffuse 40-50% disease           Mid PDA 60% stenosis at branching point of septal perforators   Residual mild to moderate disease to be treated medically Needs aggressive risk factor modification, especially diabetes and blood pressure control.     Recommend uninterrupted dual antiplatelet therapy with Aspirin 53m daily and Prasugrel 16mdaily for a minimum of 12 months (ACS - Class I recommendation).  ABI 12/27/2017: This exam reveals mildly decreased perfusion of the lower extremity, noted at the dorsalis pedis and post tibial artery level (ABI 0.87 right and 0.83 left). There is monophasic waveform right AT with diffuse plaque. Triphasic  waveform left ankle.  Recent labs: 10/21/2020: Glucose 137, BUN/Cr 16/1.03. EGFR 79. Na/K 138/4.1. Rest of the CMP normal H/H 14/41. MCV 92. Platelets 207 Chol 101, TG 132, HDL 33, LDL 46 TSH 1.5 normal  07/2018: HbA1C 11.3%   Review of Systems  Cardiovascular:  Positive for chest pain (One episdoe in 08/2020). Negative for claudication, dyspnea on exertion, leg swelling, palpitations and syncope.      Objective:    Vitals:   03/01/21 1252  BP: 129/67  Pulse: 77  Resp: 16  Temp: 98.2 F (36.8 C)  SpO2: 97%      Physical Exam Vitals and nursing note reviewed.  Constitutional:      General: He is not in acute distress. Neck:     Vascular: No JVD.  Cardiovascular:     Rate and Rhythm: Normal rate and regular rhythm.     Pulses:          Dorsalis pedis pulses are 0 on the right side and 0 on the left side.       Posterior tibial pulses are 1+ on the right side and 1+ on the left side.     Heart sounds: Murmur heard.  Harsh midsystolic murmur is present with a grade of 2/6 at the upper right sternal border radiating to the neck.  Pulmonary:     Effort: Pulmonary effort is normal.     Breath sounds: Normal breath sounds. No wheezing or rales.      Assessment & Recommendations:   [age]/o Af[place]american male w/hypertension, uncontrolled type 2 DM, CAD s/p multivessel PCI for NSTEMI (07/2018), mild aortic stenosis,  h/o prostate cancer  CAD: S/p multivessel PCI in the setting of NSTEMI (07/2018) to Diag 1,, mild Lcx, mid RCA One episode of chest tightness in 08/2020 with no recurrence. Monitor for now.  Continue Aspirin 81 mg daily. Stop prasugrel (PCI in 07/2018). Continue carvedilol, lisinopril, lipitor  Mild aortic stenosis Check echocardiogram  Carotid bruit: Check carotid [place] hyperlipidemia: Lipids well controlled (LDL 46) on Lipitor 80 mg daily.    PAD: No claudication symptoms. Continue medical management  DM: F/u w/PCP  F/u in 6  months  MaElk RiverMD [facility] Pager: [phone]: [phone] no answer Cell [phone]

## 2021-02-24 NOTE — Progress Notes (Deleted)
Patient is here for follow up visit.  Subjective:   Connor Cross, male    DOB: 26-Oct-1940, 80 y.o.   MRN: [id number]  *** No chief complaint on file.   HPI  80 y/o [place] American male w/hypertension, uncontrolled type 2 DM, CAD s/p multivessel PCI for NSTEMI (07/2018), mild aortic stenosis,  h/o prostate cancer***  Recent echocardiogram discussed, no significant change noted. Patient denies chest pain, shortness of breath, palpitations, leg edema, orthopnea, PND, TIA/syncope. He is playing golf 3 times/week without any difficulty. Diabetes is well controlled as well. He recently saw his PCP, where BP was checked. He does not recollect the readings.   Current Outpatient Medications on File Prior to Visit  Medication Sig Dispense Refill   atorvastatin (LIPITOR) 80 MG tablet Take 1 tablet (80 mg total) by mouth daily at 6 PM. 90 tablet 2   carvedilol (COREG) 6.25 MG tablet Take 1 tablet (6.25 mg total) by mouth 2 (two) times daily with a meal. 90 tablet 2   empagliflozin (JARDIANCE) 10 MG TABS tablet Take 10 mg by mouth daily. 30 tablet 3   glucose blood (TRUETRACK TEST) test strip Use as instructed 100 each 2   lisinopril (ZESTRIL) 10 MG tablet Take 1 tablet (10 mg total) by mouth daily. 90 tablet 2   metFORMIN (GLUCOPHAGE) 1000 MG tablet Take 1,000 mg by mouth 2 (two) times daily with a meal.     nitroGLYCERIN (NITROSTAT) 0.4 MG SL tablet Place 1 tablet (0.4 mg total) under the tongue every 5 (five) minutes x 3 doses as needed for chest pain. 30 tablet 1   prasugrel (EFFIENT) 10 MG TABS tablet Take 1 tablet (10 mg total) by mouth daily. 7 tablet 0   No current facility-administered medications on file prior to visit.    Cardiovascular studies:    Echocardiogram 02/24/2019: Normal LV systolic function with EF 57%. Left ventricle cavity is normal in size. Moderate concentric hypertrophy of the left ventricle. Normal global wall motion. Normal diastolic filling pattern. Calculated  EF 57%. Mild aortic valve stenosis. No regurgitation noted. Aortic valve mean gradient of 9 mmHg, Vmax of 2.0  m/s. Calculated aortic valve area by continuity equation is 1.6 cm. Inadequate TR jet to estimate pulmonary artery systolic pressure. Normal right atrial pressure. No significant change compared to previous study on 08/09/2018.      EKG 11/24/2018: Sinus rhythm 78 bpm. Left axis -anterior fascicular block.  Early R wave transition. Possible old posterior infarct.  Cath 08/12/2018: LM: Distal calcific 30% stenosis LAD: Mid LAD 50% stenosis          Diag 1 95% stenosis--->Successful PTCA and stent placement          Resolute 2.5 X 22 mm DES Diag1  LCx: Ostial 40% calcific stenosis          Mid LCx 95% stenosis--->Successful PTCA and stent placement          Resolute 3.0 X 15 mm DES mid LCx          Distal LCx/OM bifurcation 60% stenosis RCA: Prox 20% diffuse disease           Mid 80% stenosis--->Successful PTCA and stent placement           Resolute 3.5 X 18 mm DES mid RCA           Distal diffuse 40-50% disease           Mid PDA 60% stenosis at branching point  of septal perforators   Residual mild to moderate disease to be treated medically Needs aggressive risk factor modification, especially diabetes and blood pressure control.     Recommend uninterrupted dual antiplatelet therapy with Aspirin 71m daily and Prasugrel 139mdaily for a minimum of 12 months (ACS - Class I recommendation).  ABI 12/27/2017: This exam reveals mildly decreased perfusion of the lower extremity, noted at the dorsalis pedis and post tibial artery level (ABI 0.87 right and 0.83 left). There is monophasic waveform right AT with diffuse plaque. Triphasic waveform left ankle.  Recent labs: ***/***/202***: Glucose ***, BUN/Cr ***/***. EGFR ***. Na/K ***/***. ***Rest of the CMP normal H/H ***/***. MCV ***. Platelets ******% Chol ***, TG ***, HDL ***, LDL ******normal    Review of Systems   Cardiovascular:  Negative for chest pain, dyspnea on exertion, leg swelling, palpitations and syncope.      Objective:   *** There were no vitals filed for this visit.    Physical Exam Vitals and nursing note reviewed.  Constitutional:      General: He is not in acute distress. Neck:     Vascular: No JVD.  Cardiovascular:     Rate and Rhythm: Normal rate and regular rhythm.     Heart sounds: Normal heart sounds. No murmur heard. Pulmonary:     Effort: Pulmonary effort is normal.     Breath sounds: Normal breath sounds. No wheezing or rales.      Assessment & Recommendations:   [age]/o [place] male w/hypertension, uncontrolled type 2 DM, CAD s/p multivessel PCI for NSTEMI (07/2018), mild aortic stenosis,  h/o prostate cancer***  1. Coronary artery disease involving native coronary artery of native heart without angina pectoris Continue dual antiplatelet therapy with aspirin and Effient, at least till 08/2019.  After that, will consider maintenance Plavix for longer duration given his complex coronary artery disease and multivessel PCI.  Continue carvedilol 6.25 mg twice daily, lisinopril 10 mg daily. Continue aggressive management of diabetes, follow-up with PCP.  I gave him samples of Jardiance today.  2. Mild aortic stenosis Repeat echocardiogram in 08/2020.  3.  Mixed hyperlipidemia: Continue Lipitor 80 mg daily.    4. Abnormal ankle brachial index (ABI) No significant claudication symptoms.  Continue aggressive medical therapy.  I have encouraged him to walk regularly.  No indication for angiogram or revascularization at this time.  F/u in 6 months w/lipid panel check.   MaNigel MormonMD [facility] Pager: [phone]: [phone] no answer Cell [phone]

## 2021-03-01 ENCOUNTER — Other Ambulatory Visit: Payer: Self-pay

## 2021-03-01 ENCOUNTER — Encounter: Payer: Self-pay | Admitting: Cardiology

## 2021-03-01 ENCOUNTER — Ambulatory Visit: Payer: Medicare PPO | Admitting: Cardiology

## 2021-03-01 VITALS — BP 129/67 | HR 77 | Temp 98.2°F | Resp 16 | Ht 73.0 in | Wt 200.4 lb

## 2021-03-01 DIAGNOSIS — I10 Essential (primary) hypertension: Secondary | ICD-10-CM

## 2021-03-01 DIAGNOSIS — I2510 Atherosclerotic heart disease of native coronary artery without angina pectoris: Secondary | ICD-10-CM

## 2021-03-01 DIAGNOSIS — R072 Precordial pain: Secondary | ICD-10-CM

## 2021-03-01 DIAGNOSIS — I350 Nonrheumatic aortic (valve) stenosis: Secondary | ICD-10-CM

## 2021-03-01 DIAGNOSIS — E782 Mixed hyperlipidemia: Secondary | ICD-10-CM

## 2021-03-01 DIAGNOSIS — R0989 Other specified symptoms and signs involving the circulatory and respiratory systems: Secondary | ICD-10-CM

## 2021-04-04 ENCOUNTER — Ambulatory Visit: Payer: Medicare PPO

## 2021-04-04 ENCOUNTER — Telehealth: Payer: Self-pay

## 2021-04-04 ENCOUNTER — Other Ambulatory Visit: Payer: Self-pay

## 2021-04-04 DIAGNOSIS — I350 Nonrheumatic aortic (valve) stenosis: Secondary | ICD-10-CM

## 2021-04-04 DIAGNOSIS — R0989 Other specified symptoms and signs involving the circulatory and respiratory systems: Secondary | ICD-10-CM

## 2021-08-01 ENCOUNTER — Telehealth: Payer: Self-pay

## 2021-08-01 NOTE — Telephone Encounter (Signed)
Remember to speak to MP regarding scheduling

## 2021-08-01 NOTE — Telephone Encounter (Signed)
Encourage to take nitroglycerin as long as patient does not have dizziness and systolic BP is >88 mmHg. Make urgent appt to see me or CC  Thanks MJP

## 2021-08-02 NOTE — Telephone Encounter (Signed)
ok 

## 2021-08-03 ENCOUNTER — Telehealth: Payer: Self-pay | Admitting: Cardiology

## 2021-08-03 NOTE — Telephone Encounter (Signed)
Pt's wife called in and stated that she couldn't come in at 3:00 for her husbands chestpain. Stated they would wait until his appt on 12/19. Advised that if he was having chestpain he needed to be seen before then but she stated that they would wait.

## 2021-09-04 ENCOUNTER — Ambulatory Visit: Payer: Medicare PPO | Admitting: Cardiology

## 2021-09-04 ENCOUNTER — Encounter: Payer: Self-pay | Admitting: Cardiology

## 2021-09-04 ENCOUNTER — Other Ambulatory Visit: Payer: Self-pay

## 2021-09-04 VITALS — BP 149/74 | HR 81 | Temp 98.3°F | Ht 73.0 in | Wt 201.8 lb

## 2021-09-04 DIAGNOSIS — I10 Essential (primary) hypertension: Secondary | ICD-10-CM

## 2021-09-04 DIAGNOSIS — I25118 Atherosclerotic heart disease of native coronary artery with other forms of angina pectoris: Secondary | ICD-10-CM

## 2021-09-04 DIAGNOSIS — E782 Mixed hyperlipidemia: Secondary | ICD-10-CM

## 2021-09-04 MED ORDER — CARVEDILOL 12.5 MG PO TABS: 12.5000 mg | ORAL_TABLET | Freq: Two times a day (BID) | ORAL | 2 refills | Status: DC

## 2021-09-04 NOTE — Progress Notes (Signed)
Patient is here for follow up visit.  Subjective:   Connor Cross, male    DOB: Jan 26, 1941, 80 y.o.   MRN: [id number]   Chief Complaint  Patient presents with   Coronary Artery Disease   Follow-up    HPI  80 y/o African American male w/hypertension, uncontrolled type 2 DM, CAD s/p multivessel PCI for NSTEMI (07/2018), mild aortic stenosis,  h/o prostate cancer  Patient reports 2 separate chest pain complaints today.  He has a bony prominence on the right upper chest, present for last 2 years.  This hurts from time to time.  He reportedly underwent chest x-ray through PCP, that did not show any significant abnormalities, according to the patient.  In addition, patient also reports retrosternal left-sided discomfort, that feels like, " gas after eating cabbage", while at work or playing golf.  He has not had this particular pain in the last few days, as he is not been playing golf.  Current Outpatient Medications on File Prior to Visit  Medication Sig Dispense Refill   aspirin EC 81 MG tablet Take 81 mg by mouth daily. Swallow whole.     atorvastatin (LIPITOR) 80 MG tablet Take 1 tablet (80 mg total) by mouth daily at 6 PM. 90 tablet 2   carvedilol (COREG) 6.25 MG tablet Take 1 tablet (6.25 mg total) by mouth 2 (two) times daily with a meal. 90 tablet 2   empagliflozin (JARDIANCE) 10 MG TABS tablet Take 10 mg by mouth daily. 30 tablet 3   glucose blood (TRUETRACK TEST) test strip Use as instructed 100 each 2   lisinopril (ZESTRIL) 10 MG tablet Take 1 tablet (10 mg total) by mouth daily. 90 tablet 2   metFORMIN (GLUCOPHAGE) 1000 MG tablet Take 1,000 mg by mouth 2 (two) times daily with a meal.     nitroGLYCERIN (NITROSTAT) 0.4 MG SL tablet Place 1 tablet (0.4 mg total) under the tongue every 5 (five) minutes x 3 doses as needed for chest pain. 30 tablet 1   No current facility-administered medications on file prior to visit.    Cardiovascular studies:  EKG 09/04/2021: Sinus rhythm  68 bpm Left anterior fascicular block  Echocardiogram 04/04/2021:  Left ventricle cavity is normal in size. Moderate concentric hypertrophy  of the left ventricle. Normal global wall motion. Normal LV systolic  function with visual EF 50-55%. Doppler evidence of grade I (impaired)  diastolic dysfunction, normal LAP.  Trileaflet aortic valve with moderate aortic valve leaflet calcification.    Mild aortic stenosis. Trace aortic regurgitation. Vmax 2.1 m/sec, mean PG  11 mmHg, AVA 1.6 cm2 by continuity equation.  No other significant valvular abnormality.  Normal right atrial pressure.      No significant change compared to previous study in 2020.   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  Duplex suggests stenosis in the right external carotid artery (<50%).  Duplex suggests stenosis in the left internal carotid artery (1-15%).  Duplex suggests stenosis in the left external carotid artery (<50%).  Antegrade right vertebral artery flow. Antegrade left vertebral artery  flow.  Follow up in one year is appropriate if clinically indicated.  Coronary intervention 08/12/2018: LM: Distal calcific 30% stenosis LAD: Mid LAD 50% stenosis          Diag 1 95% stenosis--->Successful PTCA and stent placement          Resolute 2.5 X 22 mm DES Diag1  LCx: Ostial  40% calcific stenosis          Mid LCx 95% stenosis--->Successful PTCA and stent placement          Resolute 3.0 X 15 mm DES mid LCx          Distal LCx/OM bifurcation 60% stenosis RCA: Prox 20% diffuse disease           Mid 80% stenosis--->Successful PTCA and stent placement           Resolute 3.5 X 18 mm DES mid RCA           Distal diffuse 40-50% disease           Mid PDA 60% stenosis at branching point of septal perforators   Residual mild to moderate disease to be treated medically Needs aggressive risk factor modification, especially diabetes and blood pressure  control.     Recommend uninterrupted dual antiplatelet therapy with Aspirin 16m daily and Prasugrel 19mdaily for a minimum of 12 months (ACS - Class I recommendation).  ABI 12/27/2017: This exam reveals mildly decreased perfusion of the lower extremity, noted at the dorsalis pedis and post tibial artery level (ABI 0.87 right and 0.83 left). There is monophasic waveform right AT with diffuse plaque. Triphasic waveform left ankle.  Recent labs: 10/21/2020: Glucose 137, BUN/Cr 16/1.03. EGFR 79. Na/K 138/4.1. Rest of the CMP normal H/H 14/41. MCV 92. Platelets 207 Chol 101, TG 132, HDL 33, LDL 46 TSH 1.5 normal  07/2018: HbA1C 11.3%   Review of Systems  Cardiovascular:  Positive for chest pain (One episdoe in 08/2020). Negative for claudication, dyspnea on exertion, leg swelling, palpitations and syncope.      Objective:    Vitals:   09/04/21 1549  BP: (!) 149/74  Pulse: 81  Temp: 98.3 F (36.8 C)  SpO2: 97%       Physical Exam Vitals and nursing note reviewed.  Constitutional:      General: He is not in acute distress. Neck:     Vascular: No JVD.  Cardiovascular:     Rate and Rhythm: Normal rate and regular rhythm.     Pulses:          Dorsalis pedis pulses are 0 on the right side and 0 on the left side.       Posterior tibial pulses are 1+ on the right side and 1+ on the left side.     Heart sounds: Murmur heard.  Harsh midsystolic murmur is present with a grade of 2/6 at the upper right sternal border radiating to the neck.  Pulmonary:     Effort: Pulmonary effort is normal.     Breath sounds: Normal breath sounds. No wheezing or rales.  Chest:     Comments: Right upper chest bony prominence Musculoskeletal:     Right lower leg: No edema.     Left lower leg: No edema.      Assessment & Recommendations:   [age]/o [place] male w/hypertension, uncontrolled type 2 DM, CAD s/p multivessel PCI for NSTEMI (07/2018), mild aortic stenosis,  h/o prostate  cancer  CAD: S/p multivessel PCI in the setting of NSTEMI (07/2018) to Diag 1, mild Lcx, mid RCA  Pain from bony prominence unlikely angina.  Consider further work-up as per PCP.   However, he does have other symptoms that are concerning for angina.   Recommend exercise nuclear stress test.   In the meantime, increase carvedilol to 12.5 mg twice daily.   Continue Aspirin 81  mg daily, Lipitor, lisinopril. Will obtain labs from PCP.  Mild aortic stenosis Stable, repeat echocardiogram 02/2022.  Mixed hyperlipidemia: Lipids well controlled (LDL 46) on Lipitor 80 mg daily.    PAD: No claudication symptoms. Continue medical management  DM: F/u w/PCP  F/u after stress test   Nigel Mormon, MD [facility] Pager: [phone] Office: [phone] If no answer Cell [phone]

## 2021-10-09 ENCOUNTER — Ambulatory Visit: Payer: Medicare PPO

## 2021-10-09 ENCOUNTER — Other Ambulatory Visit: Payer: Self-pay

## 2021-10-09 DIAGNOSIS — I25118 Atherosclerotic heart disease of native coronary artery with other forms of angina pectoris: Secondary | ICD-10-CM

## 2021-10-17 ENCOUNTER — Encounter: Payer: Self-pay | Admitting: Student

## 2021-10-17 ENCOUNTER — Ambulatory Visit: Payer: Medicare PPO | Admitting: Student

## 2021-10-17 ENCOUNTER — Other Ambulatory Visit: Payer: Self-pay

## 2021-10-17 VITALS — BP 154/71 | HR 66 | Temp 98.0°F | Resp 17 | Ht 73.0 in | Wt 206.0 lb

## 2021-10-17 DIAGNOSIS — I10 Essential (primary) hypertension: Secondary | ICD-10-CM

## 2021-10-17 DIAGNOSIS — I2510 Atherosclerotic heart disease of native coronary artery without angina pectoris: Secondary | ICD-10-CM

## 2021-10-17 MED ORDER — LISINOPRIL 20 MG PO TABS: 20.0000 mg | ORAL_TABLET | Freq: Every day | ORAL | 3 refills | Status: DC

## 2021-10-17 NOTE — Progress Notes (Signed)
Patient is here for follow up visit.  Subjective:   Connor Cross, male    DOB: 05-18-1941, 81 y.o.   MRN: [id number]   Chief Complaint  Patient presents with   Follow-up   Coronary artery disease of native artery of native heart wi   Results    HPI  81 y.o. 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   Patient presented to our office 09/04/2021 with complaints of chest pain.  Given patient's symptoms concerning for angina last office visit ordered exercise nuclear stress test.  Stress test revealed no ischemic changes on EKG normal myocardial perfusion, however he did have hypertensive response on exercise.  Last office visit had added carvedilol 12.5 mg twice daily.  Patient is tolerating addition of carvedilol without issue.  He has had no recurrence of chest pain since last office visit.  There are unfortunately no home blood pressure readings available for review.  Current Outpatient Medications on File Prior to Visit  Medication Sig Dispense Refill   aspirin EC 81 MG tablet Take 81 mg by mouth daily. Swallow whole.     atorvastatin (LIPITOR) 80 MG tablet Take 1 tablet (80 mg total) by mouth daily at 6 PM. 90 tablet 2   carvedilol (COREG) 12.5 MG tablet Take 1 tablet (12.5 mg total) by mouth 2 (two) times daily with a meal. 60 tablet 2   empagliflozin (JARDIANCE) 10 MG TABS tablet Take 10 mg by mouth daily. 30 tablet 3   glucose blood (TRUETRACK TEST) test strip Use as instructed 100 each 2   metFORMIN (GLUCOPHAGE) 1000 MG tablet Take 1,000 mg by mouth 2 (two) times daily with a meal.     nitroGLYCERIN (NITROSTAT) 0.4 MG SL tablet Place 1 tablet (0.4 mg total) under the tongue every 5 (five) minutes x 3 doses as needed for chest pain. 30 tablet 1   No current facility-administered medications on file prior to visit.    Cardiovascular studies: PCV MYOCARDIAL PERFUSION  WO LEXISCAN 10/09/2021 Exercise nuclear stress test was performed using Bruce protocol. Patient reached 4.3 METS, and 93% of age predicted maximum heart rate. Exercise capacity was low. No chest pain reported. Normal heart rate response. Resting hypertension 142/74 mmhg0 with hypertensive response on exercise, peak BP 226/108 mmHg. Stress EKG revealed no ischemic changes. Normal myocardial perfusion. Stress LVEF 58%. Intermediate risk study due to hypertensive response.  EKG 09/04/2021: Sinus rhythm 68 bpm Left anterior fascicular block  Echocardiogram 04/04/2021:  Left ventricle cavity is normal in size. Moderate concentric hypertrophy  of the left ventricle. Normal global wall motion. Normal LV systolic  function with visual EF 50-55%. Doppler evidence of grade I (impaired)  diastolic dysfunction, normal LAP.  Trileaflet aortic valve with moderate aortic valve leaflet calcification.    Mild aortic stenosis. Trace aortic regurgitation. Vmax 2.1 m/sec, mean PG  11 mmHg, AVA 1.6 cm2 by continuity equation.  No other significant valvular abnormality.  Normal right atrial pressure.      No significant change compared to previous study in 2020.   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  Duplex suggests stenosis in the right external carotid artery (<50%).  Duplex suggests stenosis in the left internal carotid artery (1-15%).  Duplex suggests stenosis in the left external carotid artery (<50%).  Antegrade right vertebral artery flow. Antegrade left vertebral artery  flow.  Follow up in one year is appropriate if clinically indicated.  Coronary intervention 08/12/2018: LM: Distal calcific 30% stenosis LAD: Mid LAD 50% stenosis          Diag 1 95% stenosis--->Successful PTCA and stent placement          Resolute 2.5 X 22 mm DES Diag1  LCx: Ostial 40% calcific stenosis          Mid LCx 95% stenosis--->Successful PTCA and  stent placement          Resolute 3.0 X 15 mm DES mid LCx          Distal LCx/OM bifurcation 60% stenosis RCA: Prox 20% diffuse disease           Mid 80% stenosis--->Successful PTCA and stent placement           Resolute 3.5 X 18 mm DES mid RCA           Distal diffuse 40-50% disease           Mid PDA 60% stenosis at branching point of septal perforators   Residual mild to moderate disease to be treated medically Needs aggressive risk factor modification, especially diabetes and blood pressure control.     Recommend uninterrupted dual antiplatelet therapy with Aspirin 60m daily and Prasugrel 154mdaily for a minimum of 12 months (ACS - Class I recommendation).  ABI 12/27/2017: This exam reveals mildly decreased perfusion of the lower extremity, noted at the dorsalis pedis and post tibial artery level (ABI 0.87 right and 0.83 left). There is monophasic waveform right AT with diffuse plaque. Triphasic waveform left ankle.  Recent labs: 10/21/2020: Glucose 137, BUN/Cr 16/1.03. EGFR 79. Na/K 138/4.1. Rest of the CMP normal H/H 14/41. MCV 92. Platelets 207 Chol 101, TG 132, HDL 33, LDL 46 TSH 1.5 normal  07/2018: HbA1C 11.3%   Review of Systems  Cardiovascular:  Positive for chest pain (no recurrence since 08/2020). Negative for claudication, dyspnea on exertion, leg swelling, palpitations and syncope.      Objective:    Vitals:   10/17/21 1523  BP: (!) 154/71  Pulse: 66  Resp: 17  Temp: 98 F (36.7 C)  SpO2: 97%       Physical Exam Vitals and nursing note reviewed.  Constitutional:      General: He is not in acute distress. Neck:     Vascular: No JVD.  Cardiovascular:     Rate and Rhythm: Normal rate and regular rhythm.     Pulses:          Dorsalis pedis pulses are 0 on the right side and 0 on the left side.       Posterior tibial pulses are 1+ on the right side and 1+ on the left side.     Heart sounds: Murmur heard.  Harsh midsystolic murmur is present with a  grade of 2/6 at the upper right sternal border radiating to the neck.  Pulmonary:     Effort: Pulmonary effort is normal.     Breath sounds: Normal breath sounds. No wheezing or rales.  Chest:     Comments: Right upper chest bony prominence Musculoskeletal:     Right lower leg: No edema.     Left lower leg: No edema.  No change in physical exam compared to last office visit     Assessment & Recommendations:   [age].o. African American male w/hypertension, uncontrolled type 2 DM, CAD s/p multivessel PCI for NSTEMI (07/2018), mild aortic stenosis,  h/o prostate cancer  CAD: S/p multivessel PCI in the setting of NSTEMI (07/2018) to Diag 1, mild Lcx, mid RCA  Reviewed and discussed results of exercise nuclear stress test, details above.  Patient's questions were addressed to his satisfaction. Stress test revealed no evidence of ischemic changes on EKG and normal myocardial perfusion, he did have hypertensive response on exercise Continue aspirin, Lipitor, and carvedilol Given uncontrolled hypertension increase lisinopril from 10 mg to 20 mg daily with repeat BMP in 1 week.  Essential Hypertension:  Blood pressure is uncontrolled Continue carvedilol 12.5 mg twice daily Increase lisinopril from 10 to 20 mg daily with repeat BMP in 1 week Patient will monitor his blood pressure daily at home and keep a written log which she will bring to his next office visit.  Patient will notify our office if blood pressure remains >130/80 mmHg on home readings. Patient admits to high dietary sodium intake, advised patient regarding DASH diet, he appears motivated to make changes.  Mild aortic stenosis Stable, repeat echocardiogram 02/2022.  Mixed hyperlipidemia: Lipids well controlled (LDL 46) on Lipitor 80 mg daily.    PAD: No claudication symptoms. Continue medical management  DM: F/u w/PCP   Follow-up in 3 months, sooner if needed, for CAD and blood pressure management.   Alethia Berthold, PA-C 10/17/2021, 3:52 PM Office: [phone]

## 2021-11-20 ENCOUNTER — Other Ambulatory Visit: Payer: Self-pay | Admitting: Internal Medicine

## 2021-11-21 LAB — CBC
HCT: 39.8 % (ref 38.5–50.0)
Hemoglobin: 13.5 g/dL (ref 13.2–17.1)
MCH: 31.3 pg (ref 27.0–33.0)
MCHC: 33.9 g/dL (ref 32.0–36.0)
MCV: 92.1 fL (ref 80.0–100.0)
MPV: 11.9 fL (ref 7.5–12.5)
Platelets: 191 10*3/uL (ref 140–400)
RBC: 4.32 10*6/uL (ref 4.20–5.80)
RDW: 12.3 % (ref 11.0–15.0)
WBC: 7.6 10*3/uL (ref 3.8–10.8)

## 2021-11-21 LAB — LIPID PANEL
Cholesterol: 102 mg/dL (ref <200)
HDL: 33 mg/dL — ABNORMAL LOW (ref >=40)
LDL Cholesterol (Calc): 51 mg/dL (calc)
Non-HDL Cholesterol (Calc): 69 mg/dL (calc) (ref <130)
Total CHOL/HDL Ratio: 3.1 (calc) (ref <5.0)
Triglycerides: 97 mg/dL (ref <150)

## 2021-11-21 LAB — COMPLETE METABOLIC PANEL WITH GFR
AG Ratio: 1.1 (calc) (ref 1.0–2.5)
ALT: 35 U/L (ref 9–46)
AST: 41 U/L — ABNORMAL HIGH (ref 10–35)
Albumin: 3.9 g/dL (ref 3.6–5.1)
Alkaline phosphatase (APISO): 97 U/L (ref 35–144)
BUN: 13 mg/dL (ref 7–25)
CO2: 23 mmol/L (ref 20–32)
Calcium: 9.1 mg/dL (ref 8.6–10.3)
Chloride: 105 mmol/L (ref 98–110)
Creat: 0.96 mg/dL (ref 0.70–1.22)
Globulin: 3.4 g/dL (calc) (ref 1.9–3.7)
Glucose, Bld: 104 mg/dL — ABNORMAL HIGH (ref 65–99)
Potassium: 3.9 mmol/L (ref 3.5–5.3)
Sodium: 138 mmol/L (ref 135–146)
Total Bilirubin: 0.7 mg/dL (ref 0.2–1.2)
Total Protein: 7.3 g/dL (ref 6.1–8.1)
eGFR: 79 mL/min/{1.73_m2} (ref >=60)

## 2021-11-21 LAB — TSH: TSH: 1.46 mIU/L (ref 0.40–4.50)

## 2021-12-11 ENCOUNTER — Other Ambulatory Visit: Payer: Self-pay | Admitting: Cardiology

## 2021-12-11 ENCOUNTER — Ambulatory Visit: Payer: Medicare PPO | Admitting: Podiatry

## 2021-12-11 ENCOUNTER — Other Ambulatory Visit: Payer: Self-pay

## 2021-12-11 DIAGNOSIS — M79674 Pain in right toe(s): Secondary | ICD-10-CM | POA: Diagnosis not present

## 2021-12-11 DIAGNOSIS — I25118 Atherosclerotic heart disease of native coronary artery with other forms of angina pectoris: Secondary | ICD-10-CM

## 2021-12-11 DIAGNOSIS — E0843 Diabetes mellitus due to underlying condition with diabetic autonomic (poly)neuropathy: Secondary | ICD-10-CM | POA: Diagnosis not present

## 2021-12-11 DIAGNOSIS — M79675 Pain in left toe(s): Secondary | ICD-10-CM | POA: Diagnosis not present

## 2021-12-11 DIAGNOSIS — B351 Tinea unguium: Secondary | ICD-10-CM | POA: Diagnosis not present

## 2021-12-11 NOTE — Progress Notes (Signed)
? ?  SUBJECTIVE ?Patient with a history of diabetes mellitus presents to office today complaining of elongated, thickened nails that cause pain while ambulating in shoes.  Patient is unable to trim their own nails. Patient is here for further evaluation and treatment. ? ? ?Past Medical History:  ?Diagnosis Date  ? Arthritis   ? Diabetes mellitus ([place])   ? Hypertension   ? Prostate cancer [facility]) 2003  ? ? ?OBJECTIVE ?General Patient is awake, alert, and oriented x 3 and in no acute distress. ?Derm Skin is dry and supple bilateral. Negative open lesions or macerations. Remaining integument unremarkable. Nails are tender, long, thickened and dystrophic with subungual debris, consistent with onychomycosis, 1-5 bilateral. No signs of infection noted.  There is some hyperpigmentation of the right hallux nail plate ?Vasc  DP and PT pedal pulses palpable bilaterally. Temperature gradient within normal limits.  ?Neuro Epicritic and protective threshold sensation diminished bilaterally.  ?Musculoskeletal Exam No symptomatic pedal deformities noted bilateral. Muscular strength within normal limits. ? ?ASSESSMENT ?1. Diabetes Mellitus w/ peripheral neuropathy ?2.  Pain due to onychomycosis of toenails bilateral ?3.  Hyperpigmentation right hallux nail plate ? ?PLAN OF CARE ?1. Patient evaluated today.  Comprehensive diabetic foot exam performed today ?2. Instructed to maintain good pedal hygiene and foot care. Stressed importance of controlling blood sugar.  ?3. Mechanical debridement of nails 1-5 bilaterally performed using a nail nipper. Filed with dremel without incident.  ?4. Return to clinic in 3 mos.  ? ? ? ?Edrick Kins, DPM ?[facility] ? ?Dr. Edrick Kins, DPM  ?  ?[address]                                      ?[place], [place] [address]                ?Office [phone]  ?Fax [phone] ? ? ? ? ? ?

## 2022-01-11 ENCOUNTER — Ambulatory Visit: Payer: Medicare PPO | Admitting: Student

## 2022-03-12 ENCOUNTER — Other Ambulatory Visit: Payer: Self-pay | Admitting: Internal Medicine

## 2022-03-13 LAB — BASIC METABOLIC PANEL WITH GFR
BUN/Creatinine Ratio: 16 (calc) (ref 6–22)
BUN: 23 mg/dL (ref 7–25)
CO2: 25 mmol/L (ref 20–32)
Calcium: 9 mg/dL (ref 8.6–10.3)
Chloride: 99 mmol/L (ref 98–110)
Creat: 1.45 mg/dL — ABNORMAL HIGH (ref 0.70–1.22)
Glucose, Bld: 301 mg/dL — ABNORMAL HIGH (ref 65–99)
Potassium: 4.4 mmol/L (ref 3.5–5.3)
Sodium: 134 mmol/L — ABNORMAL LOW (ref 135–146)
eGFR: 48 mL/min/{1.73_m2} — ABNORMAL LOW (ref >=60)

## 2022-03-13 LAB — CBC
HCT: 41.2 % (ref 38.5–50.0)
Hemoglobin: 14.1 g/dL (ref 13.2–17.1)
MCH: 31.3 pg (ref 27.0–33.0)
MCHC: 34.2 g/dL (ref 32.0–36.0)
MCV: 91.6 fL (ref 80.0–100.0)
MPV: 11.5 fL (ref 7.5–12.5)
Platelets: 192 10*3/uL (ref 140–400)
RBC: 4.5 10*6/uL (ref 4.20–5.80)
RDW: 11.9 % (ref 11.0–15.0)
WBC: 8.5 10*3/uL (ref 3.8–10.8)

## 2022-10-10 ENCOUNTER — Other Ambulatory Visit: Payer: Self-pay | Admitting: Internal Medicine

## 2022-10-11 LAB — CBC
HCT: 43 % (ref 38.5–50.0)
Hemoglobin: 14.5 g/dL (ref 13.2–17.1)
MCH: 32.1 pg (ref 27.0–33.0)
MCHC: 33.7 g/dL (ref 32.0–36.0)
MCV: 95.1 fL (ref 80.0–100.0)
MPV: 12.2 fL (ref 7.5–12.5)
Platelets: 164 10*3/uL (ref 140–400)
RBC: 4.52 10*6/uL (ref 4.20–5.80)
RDW: 12.1 % (ref 11.0–15.0)
WBC: 6.6 10*3/uL (ref 3.8–10.8)

## 2022-10-11 LAB — LIPID PANEL
Cholesterol: 102 mg/dL (ref <200)
HDL: 31 mg/dL — ABNORMAL LOW (ref >=40)
LDL Cholesterol (Calc): 51 mg/dL (calc)
Non-HDL Cholesterol (Calc): 71 mg/dL (calc) (ref <130)
Total CHOL/HDL Ratio: 3.3 (calc) (ref <5.0)
Triglycerides: 116 mg/dL (ref <150)

## 2022-10-11 LAB — COMPLETE METABOLIC PANEL WITH GFR
AG Ratio: 1 (calc) (ref 1.0–2.5)
ALT: 68 U/L — ABNORMAL HIGH (ref 9–46)
AST: 65 U/L — ABNORMAL HIGH (ref 10–35)
Albumin: 3.6 g/dL (ref 3.6–5.1)
Alkaline phosphatase (APISO): 132 U/L (ref 35–144)
BUN: 12 mg/dL (ref 7–25)
CO2: 22 mmol/L (ref 20–32)
Calcium: 8.9 mg/dL (ref 8.6–10.3)
Chloride: 102 mmol/L (ref 98–110)
Creat: 0.85 mg/dL (ref 0.70–1.22)
Globulin: 3.7 g/dL (calc) (ref 1.9–3.7)
Glucose, Bld: 408 mg/dL — ABNORMAL HIGH (ref 65–99)
Potassium: 4.2 mmol/L (ref 3.5–5.3)
Sodium: 135 mmol/L (ref 135–146)
Total Bilirubin: 0.9 mg/dL (ref 0.2–1.2)
Total Protein: 7.3 g/dL (ref 6.1–8.1)
eGFR: 87 mL/min/{1.73_m2} (ref >=60)

## 2022-10-11 LAB — TSH: TSH: 1.33 mIU/L (ref 0.40–4.50)

## 2022-12-03 ENCOUNTER — Ambulatory Visit: Payer: Medicare PPO | Admitting: Cardiology

## 2022-12-03 ENCOUNTER — Encounter: Payer: Self-pay | Admitting: Cardiology

## 2022-12-03 VITALS — BP 154/77 | HR 84 | Resp 17 | Ht 73.0 in | Wt 200.0 lb

## 2022-12-03 DIAGNOSIS — I2510 Atherosclerotic heart disease of native coronary artery without angina pectoris: Secondary | ICD-10-CM

## 2022-12-03 DIAGNOSIS — I25118 Atherosclerotic heart disease of native coronary artery with other forms of angina pectoris: Secondary | ICD-10-CM

## 2022-12-03 DIAGNOSIS — I350 Nonrheumatic aortic (valve) stenosis: Secondary | ICD-10-CM

## 2022-12-03 MED ORDER — LISINOPRIL 5 MG PO TABS: 5.0000 mg | ORAL_TABLET | Freq: Every day | ORAL | 3 refills | Status: DC

## 2022-12-03 MED ORDER — NITROGLYCERIN 0.4 MG SL SUBL: 0.4000 mg | SUBLINGUAL_TABLET | SUBLINGUAL | 1 refills | Status: AC | PRN

## 2022-12-03 MED ORDER — CARVEDILOL 12.5 MG PO TABS: 6.2500 mg | ORAL_TABLET | Freq: Two times a day (BID) | ORAL | 0 refills | Status: DC

## 2022-12-03 NOTE — Progress Notes (Signed)
Nitro    Patient is here for follow up visit.  Subjective:   Connor Cross, male    DOB: [birth year], 82 y.o.   MRN: [id number]   Chief Complaint  Patient presents with   Coronary Artery Disease   Hypertension   Follow-up    3 months    HPI  82 y/o African American male w/hypertension, uncontrolled type 2 DM, CAD s/p multivessel PCI for NSTEMI (07/2018), mild aortic stenosis,  h/o prostate cancer  Patient has again had two distinct chest pain symptoms. One is related to his bony prominence on right upper side of his chest which bothers him with certain arm movements. The prominence has not changed any over last few years. Separately, patient had symptoms of retrosternal chest tightness while driving, lasted for couple of hours-occurred one week ago. Patient played gold yesterday and day before, walked a lot between the holes-including the hills, without any complain ts of chest pain or tightness.   Patient has similar presentation in 08/2021, underwent stress test in 09/2021 which did not show any significant abnormalities.   Noted BG 408 in 09/2022. He has been out of Jardiance for past several weeks. He also admits drinking several regular Pepsis a day.    Current Outpatient Medications:    aspirin EC 81 MG tablet, Take 81 mg by mouth daily. Swallow whole., Disp: , Rfl:    atorvastatin (LIPITOR) 80 MG tablet, Take 1 tablet (80 mg total) by mouth daily at 6 PM., Disp: 90 tablet, Rfl: 2   carvedilol (COREG) 12.5 MG tablet, TAKE ONE TABLET BY MOUTH TWICE A DAY WITH A MEAL, Disp: 180 tablet, Rfl: 2   empagliflozin (JARDIANCE) 10 MG TABS tablet, Take 10 mg by mouth daily., Disp: 30 tablet, Rfl: 3   glucose blood (TRUETRACK TEST) test strip, Use as instructed, Disp: 100 each, Rfl: 2   metFORMIN (GLUCOPHAGE) 1000 MG tablet, Take 1,000 mg by mouth 2 (two) times daily with a meal., Disp: , Rfl:    nitroGLYCERIN (NITROSTAT) 0.4 MG SL tablet, Place 1 tablet (0.4 mg total) under the tongue every  5 (five) minutes x 3 doses as needed for chest pain., Disp: 30 tablet, Rfl: 1  Cardiovascular studies:  EKG 12/03/2022: Sinus rhythm 77 bpm  Left anterior fascicular block  Exercise Tetrofosmin stress test 10/09/2021: Exercise nuclear stress test was performed using Bruce protocol. Patient reached 4.3 METS, and 93% of age predicted maximum heart rate. Exercise capacity was low. No chest pain reported. Normal heart rate response. Resting hypertension 142/74 mmhg0 with hypertensive response on exercise, peak BP 226/108 mmHg. Stress EKG revealed no ischemic changes. Normal myocardial perfusion. Stress LVEF 58%. Intermediate risk study due to hypertensive response.  Echocardiogram 04/04/2021:  Left ventricle cavity is normal in size. Moderate concentric hypertrophy  of the left ventricle. Normal global wall motion. Normal LV systolic  function with visual EF 50-55%. Doppler evidence of grade I (impaired)  diastolic dysfunction, normal LAP.  Trileaflet aortic valve with moderate aortic valve leaflet calcification.    Mild aortic stenosis. Trace aortic regurgitation. Vmax 2.1 m/sec, mean PG  11 mmHg, AVA 1.6 cm2 by continuity equation.  No other significant valvular abnormality.  Normal right atrial pressure.      No significant change compared to previous study in 2020.   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  Duplex suggests stenosis in the right external carotid artery (<50%).  Duplex suggests stenosis in the left  internal carotid artery (1-15%).  Duplex suggests stenosis in the left external carotid artery (<50%).  Antegrade right vertebral artery flow. Antegrade left vertebral artery  flow.  Follow up in one year is appropriate if clinically indicated.  Coronary intervention 08/12/2018: LM: Distal calcific 30% stenosis LAD: Mid LAD 50% stenosis          Diag 1 95% stenosis--->Successful PTCA and stent placement           Resolute 2.5 X 22 mm DES Diag1  LCx: Ostial 40% calcific stenosis          Mid LCx 95% stenosis--->Successful PTCA and stent placement          Resolute 3.0 X 15 mm DES mid LCx          Distal LCx/OM bifurcation 60% stenosis RCA: Prox 20% diffuse disease           Mid 80% stenosis--->Successful PTCA and stent placement           Resolute 3.5 X 18 mm DES mid RCA           Distal diffuse 40-50% disease           Mid PDA 60% stenosis at branching point of septal perforators   Residual mild to moderate disease to be treated medically Needs aggressive risk factor modification, especially diabetes and blood pressure control.     Recommend uninterrupted dual antiplatelet therapy with Aspirin 81mg  daily and Prasugrel 10mg  daily for a minimum of 12 months (ACS - Class I recommendation).  ABI 12/27/2017: This exam reveals mildly decreased perfusion of the lower extremity, noted at the dorsalis pedis and post tibial artery level (ABI 0.87 right and 0.83 left). There is monophasic waveform right AT with diffuse plaque. Triphasic waveform left ankle.  Recent labs: 10/10/2022: Glucose 408, BUN/Cr 12/0.85. EGFR 87. Na/K 135/4.2. AST/ALT 65/68. Rest of the CMP normal H/H 14/43. MCV 95. Platelets 164 HbA1C NA Chol 102, TG 116, HDL 31, LDL 51 TSH 1.3 normal  07/2018: HbA1C 11.3%   Review of Systems  Cardiovascular:  Positive for chest pain. Negative for claudication, dyspnea on exertion, leg swelling, palpitations and syncope.       Objective:    Vitals:   12/03/22 1357  BP: (!) 154/77  Pulse: 84  Resp: 17  SpO2: 96%       Physical Exam Vitals and nursing note reviewed.  Constitutional:      General: He is not in acute distress. Neck:     Vascular: No JVD.  Cardiovascular:     Rate and Rhythm: Normal rate and regular rhythm.     Pulses:          Dorsalis pedis pulses are 0 on the right side and 0 on the left side.       Posterior tibial pulses are 1+ on the right side and  1+ on the left side.     Heart sounds: Murmur heard.     Harsh midsystolic murmur is present with a grade of 2/6 at the upper right sternal border radiating to the neck.  Pulmonary:     Effort: Pulmonary effort is normal.     Breath sounds: Normal breath sounds. No wheezing or rales.  Chest:     Comments: Right upper chest bony prominence Musculoskeletal:     Right lower leg: No edema.     Left lower leg: No edema.       Assessment & Recommendations:   82 y/o African  American male w/hypertension, uncontrolled type 2 DM, CAD s/p multivessel PCI for NSTEMI (07/2018), mild aortic stenosis,  h/o prostate cancer  CAD: S/p multivessel PCI in the setting of NSTEMI (07/2018) to Diag 1, mild Lcx, mid RCA  Pain from bony prominence unlikely angina.  Another episode of chest tightness unrelated to physical exertion. Stress test negative in 09/2021 for similar complaints, no recurrecne of chest tightness in spite of walking last two days. Monitor for now. Use SL NTG when needed. I will see him back in 4 weeks to follow up on his symptoms. Continue Aspirin 81 mg daily, Lipitor. Reduce carvedilol from 12.5 mg bid to 6.25 mg bid to accommodate for renal protective lisinopril 5 mg daily( reportedly stopped by PCP due to lightheadedness). Check BMP, A1C in 1 week.  Mild aortic stenosis Check echocardiogram.  Mixed hyperlipidemia: Chol 102, TG 116, HDL 31, LDL 51 on Lipitor 80 mg daily.    PAD: No claudication symptoms. Continue medical management  DM: Uncontrolled. Gave Jardiance samples. Urged to not drink Pepsi. F/u w/PCP.  F/u in 4 weeks   Nigel Mormon, MD Pager: [phone] Office: [phone]

## 2022-12-06 LAB — BASIC METABOLIC PANEL
BUN/Creatinine Ratio: 14 (ref 10–24)
BUN: 14 mg/dL (ref 8–27)
CO2: 23 mmol/L (ref 20–29)
Calcium: 9.8 mg/dL (ref 8.6–10.2)
Chloride: 101 mmol/L (ref 96–106)
Creatinine, Ser: 1.01 mg/dL (ref 0.76–1.27)
Glucose: 159 mg/dL — ABNORMAL HIGH (ref 70–99)
Potassium: 4.6 mmol/L (ref 3.5–5.2)
Sodium: 139 mmol/L (ref 134–144)
eGFR: 74 mL/min/{1.73_m2} (ref >59)

## 2022-12-06 LAB — HEMOGLOBIN A1C
Est. average glucose Bld gHb Est-mCnc: 283 mg/dL
Hgb A1c MFr Bld: 11.5 % — ABNORMAL HIGH (ref 4.8–5.6)

## 2022-12-06 NOTE — Progress Notes (Signed)
Patient wife informed. She verbalized understanding.

## 2022-12-11 ENCOUNTER — Other Ambulatory Visit: Payer: Medicare PPO

## 2022-12-28 ENCOUNTER — Other Ambulatory Visit: Payer: Self-pay | Admitting: Cardiology

## 2022-12-28 DIAGNOSIS — I25118 Atherosclerotic heart disease of native coronary artery with other forms of angina pectoris: Secondary | ICD-10-CM

## 2023-01-07 ENCOUNTER — Ambulatory Visit: Payer: Medicare PPO

## 2023-01-07 DIAGNOSIS — I350 Nonrheumatic aortic (valve) stenosis: Secondary | ICD-10-CM

## 2023-01-09 ENCOUNTER — Other Ambulatory Visit: Payer: Medicare PPO

## 2023-01-10 NOTE — Progress Notes (Deleted)
Nitro    Patient is here for follow up visit.  Subjective:   Connor Cross, male    DOB: 09-08-1941, 82 y.o.   MRN: [id number]   No chief complaint on file.   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 One is related to his bony prominence on right upper side of his chest which bothers him with certain arm movements. The prominence has not changed any over last few years. Separately, patient had symptoms of retrosternal chest tightness while driving, lasted for couple of hours-occurred one week ago. Patient played gold yesterday and day before, walked a lot between the holes[place]including the [place], without any complain ts of chest pain or tightness.   Patient has similar presentation in 08/2021, underwent stress test in 09/2021 which did not show any significant abnormalities.   Noted BG 408 in 09/2022. He has been out of Jardiance for past several weeks. He also admits drinking several regular Pepsis a day.    Current Outpatient Medications:    aspirin EC 81 MG tablet, Take 81 mg by mouth daily. Swallow whole., Disp: , Rfl:    atorvastatin (LIPITOR) 80 MG tablet, Take 1 tablet (80 mg total) by mouth daily at 6 PM., Disp: 90 tablet, Rfl: 2   carvedilol (COREG) 12.5 MG tablet, TAKE ONE TABLET BY MOUTH TWICE A DAY WITH MEALS, Disp: 180 tablet, Rfl: 1   empagliflozin (JARDIANCE) 10 MG TABS tablet, Take 10 mg by mouth daily., Disp: 30 tablet, Rfl: 3   glucose blood (TRUETRACK TEST) test strip, Use as instructed, Disp: 100 each, Rfl: 2   lisinopril (ZESTRIL) 5 MG tablet, Take 1 tablet (5 mg total) by mouth daily., Disp: 30 tablet, Rfl: 3   metFORMIN (GLUCOPHAGE) 1000 MG tablet, Take 1,000 mg by mouth 2 (two) times daily with a meal., Disp: , Rfl:    nitroGLYCERIN (NITROSTAT) 0.4 MG SL tablet, Place 1 tablet (0.4 mg total)  under the tongue every 5 (five) minutes x 3 doses as needed for chest pain., Disp: 30 tablet, Rfl: 1  Cardiovascular studies:  Echocardiogram 01/07/2023:  Normal LV systolic function with visual EF 60-65%. Left ventricle cavity is normal in size. Mild concentric hypertrophy of the left ventricle. Normal global wall motion. Normal diastolic filling pattern, normal LAP. Calculated EF 64%. Trileaflet aortic valve. Mild aortic stenosis. Trace aortic regurgitation. Moderate aortic valve leaflet calcification. Vmax 2.42 m/sec, mean PG 10.7 mmHg, AVA 1.36 cm by continuity equation. previously Vmax 2.1 m/sec, mean PG 11 mmHg, AVA 1.6 cm2 by continuity equation Structurally normal tricuspid valve with no regurgitation. No evidence of pulmonary hypertension. IVC is dilated with respiratory variation. No significant change compared to 07/2021.   EKG 12/03/2022: Sinus rhythm 77 bpm  Left anterior fascicular block  Exercise Tetrofosmin stress test 10/09/2021: Exercise nuclear stress test was performed using Bruce protocol. Patient reached 4.3 METS, and 93% of age predicted maximum heart rate. Exercise capacity was low. No chest pain reported. Normal heart rate response. Resting hypertension 142/74 mmhg0 with hypertensive response on exercise, peak BP 226/108 mmHg. Stress EKG revealed no ischemic changes. Normal myocardial perfusion. Stress LVEF 58%. Intermediate risk study due to hypertensive response.  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  Duplex suggests stenosis in the right external carotid artery (<50%).  Duplex  suggests stenosis in the left internal carotid artery (1-15%).  Duplex suggests stenosis in the left external carotid artery (<50%).  Antegrade right vertebral artery flow. Antegrade left vertebral artery  flow.  Follow up in one year is appropriate if clinically indicated.  Coronary intervention 08/12/2018: LM: Distal  calcific 30% stenosis LAD: Mid LAD 50% stenosis          Diag 1 95% stenosis--->Successful PTCA and stent placement          Resolute 2.5 X 22 mm DES Diag1  LCx: Ostial 40% calcific stenosis          Mid LCx 95% stenosis--->Successful PTCA and stent placement          Resolute 3.0 X 15 mm DES mid LCx          Distal LCx/OM bifurcation 60% stenosis RCA: Prox 20% diffuse disease           Mid 80% stenosis--->Successful PTCA and stent placement           Resolute 3.5 X 18 mm DES mid RCA           Distal diffuse 40-50% disease           Mid PDA 60% stenosis at branching point of septal perforators   Residual mild to moderate disease to be treated medically Needs aggressive risk factor modification, especially diabetes and blood pressure control.     Recommend uninterrupted dual antiplatelet therapy with Aspirin 81mg  daily and Prasugrel 10mg  daily for a minimum of 12 months (ACS - Class I recommendation).  ABI 12/27/2017: This exam reveals mildly decreased perfusion of the lower extremity, noted at the dorsalis pedis and post tibial artery level (ABI 0.87 right and 0.83 left). There is monophasic waveform right AT with diffuse plaque. Triphasic waveform left ankle.  Recent labs: 10/10/2022: Glucose 408, BUN/Cr 12/0.85. EGFR 87. Na/K 135/4.2. AST/ALT 65/68. Rest of the CMP normal H/H 14/43. MCV 95. Platelets 164 HbA1C NA Chol 102, TG 116, HDL 31, LDL 51 TSH 1.3 normal  07/2018: HbA1C 11.3%   Review of Systems  Cardiovascular:  Positive for chest pain. Negative for claudication, dyspnea on exertion, leg swelling, palpitations and syncope.       Objective:    There were no vitals filed for this visit.      Physical Exam Vitals and nursing note reviewed.  Constitutional:      General: He is not in acute distress. Neck:     Vascular: No JVD.  Cardiovascular:     Rate and Rhythm: Normal rate and regular rhythm.     Pulses:          Dorsalis pedis pulses are 0 on the right  side and 0 on the left side.       Posterior tibial pulses are 1+ on the right side and 1+ on the left side.     Heart sounds: Murmur heard.     Harsh midsystolic murmur is present with a grade of 2/6 at the upper right sternal border radiating to the neck.  Pulmonary:     Effort: Pulmonary effort is normal.     Breath sounds: Normal breath sounds. No wheezing or rales.  Chest:     Comments: Right upper chest bony prominence Musculoskeletal:     Right lower leg: No edema.     Left lower leg: No edema.       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  Another episode of chest tightness unrelated to physical exertion. Stress test negative in 09/2021 for similar complaints, no recurrecne of chest tightness in spite of walking last two days. Monitor for now. Use SL NTG when needed. I will see him back in 4 weeks to follow up on his symptoms. Continue Aspirin 81 mg daily, Lipitor. Reduce carvedilol from 12.5 mg bid to 6.25 mg bid to accommodate for renal protective lisinopril 5 mg daily( reportedly stopped by PCP due to lightheadedness). Check BMP, A1C in 1 week.  Mild aortic stenosis Check echocardiogram.  Mixed hyperlipidemia: Chol 102, TG 116, HDL 31, LDL 51 on Lipitor 80 mg daily.    PAD: No claudication symptoms. Continue medical management  DM: Uncontrolled. Gave Jardiance samples. Urged to not drink Pepsi. F/u w/PCP.  F/u in 4 weeks   Elder Negus, MD Pager: [phone] Office: [phone]

## 2023-01-14 ENCOUNTER — Ambulatory Visit: Payer: Medicare PPO | Admitting: Cardiology

## 2023-02-18 ENCOUNTER — Ambulatory Visit: Payer: Medicare PPO | Admitting: Cardiology

## 2023-02-18 ENCOUNTER — Encounter: Payer: Self-pay | Admitting: Cardiology

## 2023-02-18 DIAGNOSIS — I2510 Atherosclerotic heart disease of native coronary artery without angina pectoris: Secondary | ICD-10-CM

## 2023-02-18 DIAGNOSIS — I25118 Atherosclerotic heart disease of native coronary artery with other forms of angina pectoris: Secondary | ICD-10-CM

## 2023-02-18 MED ORDER — LISINOPRIL 5 MG PO TABS: 5.0000 mg | ORAL_TABLET | Freq: Every day | ORAL | 3 refills | Status: DC

## 2023-02-18 MED ORDER — CARVEDILOL 6.25 MG PO TABS: 6.2500 mg | ORAL_TABLET | Freq: Two times a day (BID) | ORAL | 2 refills | Status: AC

## 2023-02-18 NOTE — Progress Notes (Signed)
Nitro    Patient is here for follow up visit.  Subjective:   Connor Cross, male    DOB: March 25, 1941, 82 y.o.   MRN: [id number]   Chief Complaint  Patient presents with   Coronary Artery Disease   Follow-up    4 week    HPI  82 y/o African American male w/hypertension, uncontrolled type 2 DM, CAD s/p multivessel PCI for NSTEMI (07/2018), mild aortic stenosis,  h/o prostate cancer  Patient denies chest pain, shortness of breath, palpitations, leg edema, orthopnea, PND, TIA/syncope. [place]. There is some confusion about what medications he has been taking.     Current Outpatient Medications:    aspirin EC 81 MG tablet, Take 81 mg by mouth daily. Swallow whole., Disp: , Rfl:    atorvastatin (LIPITOR) 80 MG tablet, Take 1 tablet (80 mg total) by mouth daily at 6 PM., Disp: 90 tablet, Rfl: 2   carvedilol (COREG) 12.5 MG tablet, TAKE ONE TABLET BY MOUTH TWICE A DAY WITH MEALS, Disp: 180 tablet, Rfl: 1   empagliflozin (JARDIANCE) 10 MG TABS tablet, Take 10 mg by mouth daily., Disp: 30 tablet, Rfl: 3   glucose blood (TRUETRACK TEST) test strip, Use as instructed, Disp: 100 each, Rfl: 2   lisinopril (ZESTRIL) 5 MG tablet, Take 1 tablet (5 mg total) by mouth daily., Disp: 30 tablet, Rfl: 3   metFORMIN (GLUCOPHAGE) 1000 MG tablet, Take 1,000 mg by mouth 2 (two) times daily with a meal., Disp: , Rfl:    nitroGLYCERIN (NITROSTAT) 0.4 MG SL tablet, Place 1 tablet (0.4 mg total) under the tongue every 5 (five) minutes x 3 doses as needed for chest pain., Disp: 30 tablet, Rfl: 1  Cardiovascular studies:  EKG 12/03/2022: Sinus rhythm 77 bpm  Left anterior fascicular block  Echocardiogram 01/07/2023: Normal LV systolic function with visual EF 60-65%. Left ventricle cavity is normal in size. Mild concentric hypertrophy of the left ventricle. Normal global wall motion. Normal diastolic filling pattern, normal LAP. Calculated EF  64%. Trileaflet aortic valve. Mild aortic stenosis. Trace aortic regurgitation. Moderate aortic valve leaflet calcification. Vmax 2.42 m/sec, mean PG 10.7 mmHg, AVA 1.36 cm by continuity equation. previously Vmax 2.1 m/sec, mean PG 11 mmHg, AVA 1.6 cm2 by continuity equation Structurally normal tricuspid valve with no regurgitation. No evidence of pulmonary hypertension. IVC is dilated with respiratory variation. No significant change compared to 07/2021.  Exercise Tetrofosmin stress test 10/09/2021: Exercise nuclear stress test was performed using Bruce protocol. Patient reached 4.3 METS, and 93% of age predicted maximum heart rate. Exercise capacity was low. No chest pain reported. Normal heart rate response. Resting hypertension 142/74 mmhg0 with hypertensive response on exercise, peak BP 226/108 mmHg. Stress EKG revealed no ischemic changes. Normal myocardial perfusion. Stress LVEF 58%. Intermediate risk study due to hypertensive response.  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  Duplex suggests stenosis in the right external carotid artery (<50%).  Duplex suggests stenosis in the left internal carotid artery (1-15%).  Duplex suggests stenosis in the left external carotid artery (<50%).  Antegrade right vertebral artery flow. Antegrade left vertebral artery  flow.  Follow up in one year is appropriate if clinically indicated.  Coronary intervention 08/12/2018: LM: Distal calcific 30% stenosis LAD: Mid LAD 50% stenosis          Diag 1 95% stenosis--->Successful PTCA and stent placement  Resolute 2.5 X 22 mm DES Diag1  LCx: Ostial 40% calcific stenosis          Mid LCx 95% stenosis--->Successful PTCA and stent placement          Resolute 3.0 X 15 mm DES mid LCx          Distal LCx/OM bifurcation 60% stenosis RCA: Prox 20% diffuse disease           Mid 80% stenosis--->Successful PTCA and stent placement            Resolute 3.5 X 18 mm DES mid RCA           Distal diffuse 40-50% disease           Mid PDA 60% stenosis at branching point of septal perforators   Residual mild to moderate disease to be treated medically Needs aggressive risk factor modification, especially diabetes and blood pressure control.     Recommend uninterrupted dual antiplatelet therapy with Aspirin 81mg  daily and Prasugrel 10mg  daily for a minimum of 12 months (ACS - Class I recommendation).  ABI 12/27/2017: This exam reveals mildly decreased perfusion of the lower extremity, noted at the dorsalis pedis and post tibial artery level (ABI 0.87 right and 0.83 left). There is monophasic waveform right AT with diffuse plaque. Triphasic waveform left ankle.  Recent labs: 12/05/2022: Glucose 159, BUN/Cr 14/1.01. EGFR 74. Na/K 139/4.6.  HbA1C 11.5%  10/10/2022: Glucose 408, BUN/Cr 12/0.85. EGFR 87. Na/K 135/4.2. AST/ALT 65/68. Rest of the CMP normal H/H 14/43. MCV 95. Platelets 164 HbA1C NA Chol 102, TG 116, HDL 31, LDL 51 TSH 1.3 normal  07/2018: HbA1C 11.3%   Review of Systems  Cardiovascular:  Positive for chest pain. Negative for claudication, dyspnea on exertion, leg swelling, palpitations and syncope.       Objective:    Vitals:   02/18/23 1447  BP: (!) 144/71  Pulse: 87  Resp: 17  SpO2: 98%       Physical Exam Vitals and nursing note reviewed.  Constitutional:      General: He is not in acute distress. Neck:     Vascular: No JVD.  Cardiovascular:     Rate and Rhythm: Normal rate and regular rhythm.     Pulses:          Dorsalis pedis pulses are 0 on the right side and 0 on the left side.       Posterior tibial pulses are 1+ on the right side and 1+ on the left side.     Heart sounds: Murmur heard.     Harsh midsystolic murmur is present with a grade of 2/6 at the upper right sternal border radiating to the neck.  Pulmonary:     Effort: Pulmonary effort is normal.     Breath sounds: Normal  breath sounds. No wheezing or rales.  Chest:     Comments: Right upper chest bony prominence Musculoskeletal:     Right lower leg: No edema.     Left lower leg: No edema.      Assessment & Recommendations:   82 y/o [place] male w/hypertension, uncontrolled type 2 DM, CAD s/p multivessel PCI for NSTEMI (07/2018), mild aortic stenosis,  h/o prostate cancer  CAD: S/p multivessel PCI in the setting of NSTEMI (07/2018) to Diag 1, mild Lcx, mid RCA  Pain from bony prominence unlikely angina.  Another episode of chest tightness unrelated to physical exertion. Stress test negative in 09/2021 for similar complaints, no recurrecne  of chest pain. Continue Aspirin 81 mg daily, Lipitor. Recommend carvedilol 6.25 mg bid, lisinopril 5 mg daily for now, need to verify the dose at next visit.  Mild aortic stenosis: Stable (echocardiogram 12/2022).  Mixed hyperlipidemia: Chol 102, TG 116, HDL 31, LDL 51 on Lipitor 80 mg daily.    PAD: No claudication symptoms. Continue medical management  DM: Uncontrolled. Gave Jardiance samples. Again urged to not drink Pepsi. F/u w/PCP.  F/u in 4 weeks   Elder Negus, MD Pager: [phone] Office: [phone]

## 2023-03-07 ENCOUNTER — Other Ambulatory Visit: Payer: Self-pay | Admitting: Cardiology

## 2023-03-07 DIAGNOSIS — I2510 Atherosclerotic heart disease of native coronary artery without angina pectoris: Secondary | ICD-10-CM

## 2023-03-20 ENCOUNTER — Encounter: Payer: Medicare PPO | Admitting: Cardiology

## 2023-03-20 NOTE — Progress Notes (Deleted)
Nitro    Patient is here for follow up visit.  Subjective:   Connor Cross, male    DOB: 10-09-1940, 82 y.o.   MRN: [id number]   Chief Complaint  Patient presents with   Coronary artery disease of native artery of native heart wi   Follow-up    HPI  82 y/o African American male w/hypertension, uncontrolled type 2 DM, CAD s/p multivessel PCI for NSTEMI (07/2018), mild aortic stenosis,  h/o prostate cancer  Patient denies chest pain, shortness of breath, palpitations, leg edema, orthopnea, PND, TIA/syncope. [place]. There is some confusion about what medications he has been taking.     Current Outpatient Medications:    aspirin EC 81 MG tablet, Take 81 mg by mouth daily. Swallow whole., Disp: , Rfl:    atorvastatin (LIPITOR) 80 MG tablet, Take 1 tablet (80 mg total) by mouth daily at 6 PM., Disp: 90 tablet, Rfl: 2   carvedilol (COREG) 6.25 MG tablet, Take 1 tablet (6.25 mg total) by mouth 2 (two) times daily with a meal., Disp: 60 tablet, Rfl: 2   empagliflozin (JARDIANCE) 10 MG TABS tablet, Take 10 mg by mouth daily., Disp: 30 tablet, Rfl: 3   glucose blood (TRUETRACK TEST) test strip, Use as instructed, Disp: 100 each, Rfl: 2   lisinopril (ZESTRIL) 5 MG tablet, TAKE 1 TABLET BY MOUTH DAILY, Disp: 90 tablet, Rfl: 3   metFORMIN (GLUCOPHAGE) 1000 MG tablet, Take 1,000 mg by mouth 2 (two) times daily with a meal., Disp: , Rfl:    nitroGLYCERIN (NITROSTAT) 0.4 MG SL tablet, Place 1 tablet (0.4 mg total) under the tongue every 5 (five) minutes x 3 doses as needed for chest pain., Disp: 30 tablet, Rfl: 1  Cardiovascular studies:  EKG 12/03/2022: Sinus rhythm 77 bpm  Left anterior fascicular block  Echocardiogram 01/07/2023: Normal LV systolic function with visual EF 60-65%. Left ventricle cavity is normal in size. Mild concentric hypertrophy of the left ventricle. Normal global wall motion. Normal diastolic filling pattern, normal  LAP. Calculated EF 64%. Trileaflet aortic valve. Mild aortic stenosis. Trace aortic regurgitation. Moderate aortic valve leaflet calcification. Vmax 2.42 m/sec, mean PG 10.7 mmHg, AVA 1.36 cm by continuity equation. previously Vmax 2.1 m/sec, mean PG 11 mmHg, AVA 1.6 cm2 by continuity equation Structurally normal tricuspid valve with no regurgitation. No evidence of pulmonary hypertension. IVC is dilated with respiratory variation. No significant change compared to 07/2021.  Exercise Tetrofosmin stress test 10/09/2021: Exercise nuclear stress test was performed using Bruce protocol. Patient reached 4.3 METS, and 93% of age predicted maximum heart rate. Exercise capacity was low. No chest pain reported. Normal heart rate response. Resting hypertension 142/74 mmhg0 with hypertensive response on exercise, peak BP 226/108 mmHg. Stress EKG revealed no ischemic changes. Normal myocardial perfusion. Stress LVEF 58%. Intermediate risk study due to hypertensive response.  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  Duplex suggests stenosis in the right external carotid artery (<50%).  Duplex suggests stenosis in the left internal carotid artery (1-15%).  Duplex suggests stenosis in the left external carotid artery (<50%).  Antegrade right vertebral artery flow. Antegrade left vertebral artery  flow.  Follow up in one year is appropriate if clinically indicated.  Coronary intervention 08/12/2018: LM: Distal calcific 30% stenosis LAD: Mid LAD 50% stenosis          Diag 1 95% stenosis--->Successful PTCA and stent placement  Resolute 2.5 X 22 mm DES Diag1  LCx: Ostial 40% calcific stenosis          Mid LCx 95% stenosis--->Successful PTCA and stent placement          Resolute 3.0 X 15 mm DES mid LCx          Distal LCx/OM bifurcation 60% stenosis RCA: Prox 20% diffuse disease           Mid 80% stenosis--->Successful PTCA and  stent placement           Resolute 3.5 X 18 mm DES mid RCA           Distal diffuse 40-50% disease           Mid PDA 60% stenosis at branching point of septal perforators   Residual mild to moderate disease to be treated medically Needs aggressive risk factor modification, especially diabetes and blood pressure control.     Recommend uninterrupted dual antiplatelet therapy with Aspirin 81mg  daily and Prasugrel 10mg  daily for a minimum of 12 months (ACS - Class I recommendation).  ABI 12/27/2017: This exam reveals mildly decreased perfusion of the lower extremity, noted at the dorsalis pedis and post tibial artery level (ABI 0.87 right and 0.83 left). There is monophasic waveform right AT with diffuse plaque. Triphasic waveform left ankle.  Recent labs: 12/05/2022: Glucose 159, BUN/Cr 14/1.01. EGFR 74. Na/K 139/4.6.  HbA1C 11.5%  10/10/2022: Glucose 408, BUN/Cr 12/0.85. EGFR 87. Na/K 135/4.2. AST/ALT 65/68. Rest of the CMP normal H/H 14/43. MCV 95. Platelets 164 HbA1C NA Chol 102, TG 116, HDL 31, LDL 51 TSH 1.3 normal  07/2018: HbA1C 11.3%   Review of Systems  Cardiovascular:  Positive for chest pain. Negative for claudication, dyspnea on exertion, leg swelling, palpitations and syncope.       Objective:    There were no vitals filed for this visit.      Physical Exam Vitals and nursing note reviewed.  Constitutional:      General: He is not in acute distress. Neck:     Vascular: No JVD.  Cardiovascular:     Rate and Rhythm: Normal rate and regular rhythm.     Pulses:          Dorsalis pedis pulses are 0 on the right side and 0 on the left side.       Posterior tibial pulses are 1+ on the right side and 1+ on the left side.     Heart sounds: Murmur heard.     Harsh midsystolic murmur is present with a grade of 2/6 at the upper right sternal border radiating to the neck.  Pulmonary:     Effort: Pulmonary effort is normal.     Breath sounds: Normal breath sounds. No  wheezing or rales.  Chest:     Comments: Right upper chest bony prominence Musculoskeletal:     Right lower leg: No edema.     Left lower leg: No edema.       Assessment & Recommendations:   82 y/o [place] male w/hypertension, uncontrolled type 2 DM, CAD s/p multivessel PCI for NSTEMI (07/2018), mild aortic stenosis,  h/o prostate cancer  *** CAD: S/p multivessel PCI in the setting of NSTEMI (07/2018) to Diag 1, mild Lcx, mid RCA  Pain from bony prominence unlikely angina.  Another episode of chest tightness unrelated to physical exertion. Stress test negative in 09/2021 for similar complaints, no recurrecne of chest pain. Continue Aspirin 81 mg daily, Lipitor. ***  Recommend carvedilol 6.25 mg bid, lisinopril 5 mg daily for now, need to verify the dose at next visit.  Mild aortic stenosis: Stable (echocardiogram 12/2022).  *** Mixed hyperlipidemia: Chol 102, TG 116, HDL 31, LDL 51 on Lipitor 80 mg daily.    *** PAD: No claudication symptoms. Continue medical management  *** DM: Uncontrolled. Gave Jardiance samples. Again urged to not drink Pepsi. F/u w/PCP.  F/u in ***   Elder Negus, MD Pager: [phone] Office: [phone]

## 2023-05-01 NOTE — Telephone Encounter (Signed)
done

## 2023-05-16 ENCOUNTER — Ambulatory Visit: Payer: Medicare PPO | Admitting: Cardiology

## 2023-05-16 ENCOUNTER — Encounter: Payer: Self-pay | Admitting: Cardiology

## 2023-05-16 VITALS — BP 133/65 | HR 73 | Resp 16 | Ht 73.0 in | Wt 197.0 lb

## 2023-05-16 DIAGNOSIS — E782 Mixed hyperlipidemia: Secondary | ICD-10-CM

## 2023-05-16 DIAGNOSIS — I350 Nonrheumatic aortic (valve) stenosis: Secondary | ICD-10-CM

## 2023-05-16 DIAGNOSIS — I25118 Atherosclerotic heart disease of native coronary artery with other forms of angina pectoris: Secondary | ICD-10-CM

## 2023-05-16 DIAGNOSIS — I10 Essential (primary) hypertension: Secondary | ICD-10-CM

## 2023-05-16 NOTE — Progress Notes (Signed)
Patient is here for follow up visit.  Subjective:   Connor Cross, male    DOB: 1941/07/18, 82 y.o.   MRN: [id number]   Chief Complaint  Patient presents with   Coronary Artery Disease   Follow-up    4 week    HPI  82 y/o African American male w/hypertension, uncontrolled type 2 DM, CAD s/p multivessel PCI for NSTEMI (07/2018), mild aortic stenosis,  h/o prostate cancer  Patient denies chest pain, shortness of breath, palpitations, leg edema, orthopnea, PND, TIA/syncope. He has tingling in his fingers and toes at night.     Current Outpatient Medications:    aspirin EC 81 MG tablet, Take 81 mg by mouth daily. Swallow whole., Disp: , Rfl:    atorvastatin (LIPITOR) 80 MG tablet, Take 1 tablet (80 mg total) by mouth daily at 6 PM., Disp: 90 tablet, Rfl: 2   carvedilol (COREG) 6.25 MG tablet, Take 1 tablet (6.25 mg total) by mouth 2 (two) times daily with a meal., Disp: 60 tablet, Rfl: 2   empagliflozin (JARDIANCE) 10 MG TABS tablet, Take 10 mg by mouth daily., Disp: 30 tablet, Rfl: 3   glucose blood (TRUETRACK TEST) test strip, Use as instructed, Disp: 100 each, Rfl: 2   lisinopril (ZESTRIL) 5 MG tablet, TAKE 1 TABLET BY MOUTH DAILY, Disp: 90 tablet, Rfl: 3   metFORMIN (GLUCOPHAGE) 1000 MG tablet, Take 1,000 mg by mouth 2 (two) times daily with a meal., Disp: , Rfl:    nitroGLYCERIN (NITROSTAT) 0.4 MG SL tablet, Place 1 tablet (0.4 mg total) under the tongue every 5 (five) minutes x 3 doses as needed for chest pain., Disp: 30 tablet, Rfl: 1  Cardiovascular studies:  EKG 12/03/2022: Sinus rhythm 77 bpm  Left anterior fascicular block  Echocardiogram 01/07/2023: Normal LV systolic function with visual EF 60-65%. Left ventricle cavity is normal in size. Mild concentric hypertrophy of the left ventricle. Normal global wall motion. Normal diastolic filling pattern, normal LAP. Calculated EF 64%. Trileaflet aortic valve. Mild aortic stenosis. Trace aortic regurgitation. Moderate  aortic valve leaflet calcification. Vmax 2.42 m/sec, mean PG 10.7 mmHg, AVA 1.36 cm by continuity equation. previously Vmax 2.1 m/sec, mean PG 11 mmHg, AVA 1.6 cm2 by continuity equation Structurally normal tricuspid valve with no regurgitation. No evidence of pulmonary hypertension. IVC is dilated with respiratory variation. No significant change compared to 07/2021.  Exercise Tetrofosmin stress test 10/09/2021: Exercise nuclear stress test was performed using Bruce protocol. Patient reached 4.3 METS, and 93% of age predicted maximum heart rate. Exercise capacity was low. No chest pain reported. Normal heart rate response. Resting hypertension 142/74 mmhg0 with hypertensive response on exercise, peak BP 226/108 mmHg. Stress EKG revealed no ischemic changes. Normal myocardial perfusion. Stress LVEF 58%. Intermediate risk study due to hypertensive response.  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  Duplex suggests stenosis in the right external carotid artery (<50%).  Duplex suggests stenosis in the left internal carotid artery (1-15%).  Duplex suggests stenosis in the left external carotid artery (<50%).  Antegrade right vertebral artery flow. Antegrade left vertebral artery  flow.  Follow up in one year is appropriate if clinically indicated.  Coronary intervention 08/12/2018: LM: Distal calcific 30% stenosis LAD: Mid LAD 50% stenosis          Diag 1 95% stenosis--->Successful PTCA and stent placement          Resolute 2.5 X 22 mm DES Diag1  LCx: Ostial 40% calcific stenosis          Mid LCx 95% stenosis--->Successful PTCA and stent placement          Resolute 3.0 X 15 mm DES mid LCx          Distal LCx/OM bifurcation 60% stenosis RCA: Prox 20% diffuse disease           Mid 80% stenosis--->Successful PTCA and stent placement           Resolute 3.5 X 18 mm DES mid RCA           Distal diffuse 40-50% disease            Mid PDA 60% stenosis at branching point of septal perforators   Residual mild to moderate disease to be treated medically Needs aggressive risk factor modification, especially diabetes and blood pressure control.     Recommend uninterrupted dual antiplatelet therapy with Aspirin 81mg  daily and Prasugrel 10mg  daily for a minimum of 12 months (ACS - Class I recommendation).  ABI 12/27/2017: This exam reveals mildly decreased perfusion of the lower extremity, noted at the dorsalis pedis and post tibial artery level (ABI 0.87 right and 0.83 left). There is monophasic waveform right AT with diffuse plaque. Triphasic waveform left ankle.  Recent labs: 12/05/2022: Glucose 159, BUN/Cr 14/1.01. EGFR 74. Na/K 139/4.6.  HbA1C 11.5%  10/10/2022: Glucose 408, BUN/Cr 12/0.85. EGFR 87. Na/K 135/4.2. AST/ALT 65/68. Rest of the CMP normal H/H 14/43. MCV 95. Platelets 164 HbA1C NA Chol 102, TG 116, HDL 31, LDL 51 TSH 1.3 normal  07/2018: HbA1C 11.3%   Review of Systems  Cardiovascular:  Negative for chest pain, claudication, dyspnea on exertion, leg swelling, palpitations and syncope.  Neurological:  Positive for numbness.       Objective:    Vitals:   05/16/23 1133  BP: 133/65  Pulse: 73  Resp: 16  SpO2: 97%        Physical Exam Vitals and nursing note reviewed.  Constitutional:      General: He is not in acute distress. Neck:     Vascular: No JVD.  Cardiovascular:     Rate and Rhythm: Normal rate and regular rhythm.     Pulses:          Dorsalis pedis pulses are 0 on the right side and 0 on the left side.       Posterior tibial pulses are 1+ on the right side and 1+ on the left side.     Heart sounds: Murmur heard.     Harsh midsystolic murmur is present with a grade of 2/6 at the upper right sternal border radiating to the neck.  Pulmonary:     Effort: Pulmonary effort is normal.     Breath sounds: Normal breath sounds. No wheezing or rales.  Chest:     Comments: Right  upper chest bony prominence Musculoskeletal:     Right lower leg: No edema.     Left lower leg: No edema.      Assessment & Recommendations:   82 y/o [place] American male w/hypertension, uncontrolled type 2 DM, CAD s/p multivessel PCI for NSTEMI (07/2018), mild aortic stenosis,  h/o prostate cancer  CAD: S/p multivessel PCI in the setting of NSTEMI (07/2018) to Diag 1, mild Lcx, mid RCA.  No angina symptoms. Continue Aspirin 81 mg daily, Lipitor 80 mg daily, carvedilol 6.25 mg bid, lisinopril 5 mg daily.  Mild aortic stenosis:Stable (echocardiogram 12/2022).  Mixed hyperlipidemia: Chol  102, TG 116, HDL 31, LDL 51 on Lipitor 80 mg daily.    PAD: No claudication symptoms. Continue medical management  DM: F/u w/PCP. Consider workup and management for neuropathy.  F/u in 6 months   Elder Negus, MD Pager: [phone] Office: [phone]

## 2023-05-22 ENCOUNTER — Other Ambulatory Visit: Payer: Self-pay | Admitting: Cardiology

## 2023-05-22 DIAGNOSIS — I2510 Atherosclerotic heart disease of native coronary artery without angina pectoris: Secondary | ICD-10-CM

## 2023-05-23 NOTE — Progress Notes (Signed)
This encounter was created in error - please disregard.

## 2023-11-14 ENCOUNTER — Ambulatory Visit: Payer: Medicare PPO | Attending: Cardiology | Admitting: Cardiology

## 2023-11-14 NOTE — Progress Notes (Deleted)
  Cardiology Office Note:  .   Date:  11/14/2023  ID:  Connor Cross, DOB 02-05-1941, MRN [id number] PCP: Fleet Contras, MD  [facility] Providers Cardiologist:  Truett Mainland, MD PCP: Fleet Contras, MD  No chief complaint on file.     History of Present Illness: .    Connor Cross is a 83 y.o. male with hypertension, uncontrolled type 2 DM, CAD s/p multivessel PCI for NSTEMI (07/2018), mild aortic stenosis,  h/o prostate cancer    ***  There were no vitals filed for this visit.   ROS: *** ROS   Studies Reviewed: .        *** Independently interpreted 11/2022: Chol 102, TG 116, HDL 31, LDL 51 HbA1C 11.5% Hb 14.5 Cr 1.01 ***  Risk Assessment/Calculations:   {Does this patient have ATRIAL FIBRILLATION?:[id number]}     Physical Exam:   Physical Exam   VISIT DIAGNOSES: No diagnosis found.   ASSESSMENT AND PLAN: .    Connor Cross is a 83 y.o. male with hypertension, uncontrolled type 2 DM, CAD s/p multivessel PCI for NSTEMI (07/2018), mild aortic stenosis,  h/o prostate cancer   *** CAD: S/p multivessel PCI in the setting of NSTEMI (07/2018) to Diag 1, mild Lcx, mid RCA.  No angina symptoms. Continue Aspirin 81 mg daily, Lipitor 80 mg daily, carvedilol 6.25 mg bid, lisinopril 5 mg daily.   *** Mild aortic stenosis: Stable (echocardiogram 12/2022). Repeat echocardiogram 08/2024.   *** Mixed hyperlipidemia: Chol 102, TG 116, HDL 31, LDL 51 on Lipitor 80 mg daily.     PAD: No claudication symptoms. Continue medical management   DM: F/u w/PCP. Consider workup and management for neuropathy.    {Are you ordering a CV Procedure (e.g. stress test, cath, DCCV, TEE, etc)?   Press F2        :[id number]}    No orders of the defined types were placed in this encounter.    F/u in ***  Signed, Elder Negus, MD

## 2023-12-19 ENCOUNTER — Encounter: Payer: Self-pay | Admitting: Gastroenterology

## 2024-01-07 ENCOUNTER — Ambulatory Visit: Admitting: Gastroenterology

## 2024-01-07 ENCOUNTER — Encounter: Payer: Self-pay | Admitting: Gastroenterology

## 2024-01-07 ENCOUNTER — Other Ambulatory Visit (INDEPENDENT_AMBULATORY_CARE_PROVIDER_SITE_OTHER)

## 2024-01-07 VITALS — BP 106/60 | HR 60 | Ht 71.0 in | Wt 202.2 lb

## 2024-01-07 DIAGNOSIS — K8020 Calculus of gallbladder without cholecystitis without obstruction: Secondary | ICD-10-CM

## 2024-01-07 DIAGNOSIS — Z860100 Personal history of colon polyps, unspecified: Secondary | ICD-10-CM | POA: Diagnosis not present

## 2024-01-07 DIAGNOSIS — R7989 Other specified abnormal findings of blood chemistry: Secondary | ICD-10-CM | POA: Diagnosis not present

## 2024-01-07 LAB — CBC WITH DIFFERENTIAL/PLATELET
Basophils Absolute: 0.1 10*3/uL (ref 0.0–0.1)
Basophils Relative: 0.9 % (ref 0.0–3.0)
Eosinophils Absolute: 0.2 10*3/uL (ref 0.0–0.7)
Eosinophils Relative: 2.4 % (ref 0.0–5.0)
HCT: 42.7 % (ref 39.0–52.0)
Hemoglobin: 14.3 g/dL (ref 13.0–17.0)
Lymphocytes Relative: 32.3 % (ref 12.0–46.0)
Lymphs Abs: 2.7 10*3/uL (ref 0.7–4.0)
MCHC: 33.6 g/dL (ref 30.0–36.0)
MCV: 96.2 fl (ref 78.0–100.0)
Monocytes Absolute: 1.1 10*3/uL — ABNORMAL HIGH (ref 0.1–1.0)
Monocytes Relative: 13.8 % — ABNORMAL HIGH (ref 3.0–12.0)
Neutro Abs: 4.2 10*3/uL (ref 1.4–7.7)
Neutrophils Relative %: 50.6 % (ref 43.0–77.0)
Platelets: 156 10*3/uL (ref 150.0–400.0)
RBC: 4.43 Mil/uL (ref 4.22–5.81)
RDW: 13.5 % (ref 11.5–15.5)
WBC: 8.3 10*3/uL (ref 4.0–10.5)

## 2024-01-07 LAB — COMPREHENSIVE METABOLIC PANEL WITH GFR
ALT: 46 U/L (ref 0–53)
AST: 51 U/L — ABNORMAL HIGH (ref 0–37)
Albumin: 3.7 g/dL (ref 3.5–5.2)
Alkaline Phosphatase: 107 U/L (ref 39–117)
BUN: 15 mg/dL (ref 6–23)
CO2: 28 meq/L (ref 19–32)
Calcium: 9.4 mg/dL (ref 8.4–10.5)
Chloride: 105 meq/L (ref 96–112)
Creatinine, Ser: 1.04 mg/dL (ref 0.40–1.50)
GFR: 66.1 mL/min (ref >60.00)
Glucose, Bld: 115 mg/dL — ABNORMAL HIGH (ref 70–99)
Potassium: 4.3 meq/L (ref 3.5–5.1)
Sodium: 139 meq/L (ref 135–145)
Total Bilirubin: 0.7 mg/dL (ref 0.2–1.2)
Total Protein: 7.8 g/dL (ref 6.0–8.3)

## 2024-01-07 LAB — IBC + FERRITIN
Ferritin: 79.3 ng/mL (ref 22.0–322.0)
Iron: 101 ug/dL (ref 42–165)
Saturation Ratios: 34 % (ref 20.0–50.0)
TIBC: 296.8 ug/dL (ref 250.0–450.0)
Transferrin: 212 mg/dL (ref 212.0–360.0)

## 2024-01-07 NOTE — Progress Notes (Signed)
 Chief Complaint :eval of asymptomatic gallstones w/elevated transaminases  Primary GI Doctor: (previously Dr. Howard Macho) Dr. Venice Gillis  HPI:  Patient is a  83  year old male patient with past medical history of prostate cancer, hypertension, diabetes, arthritis, who was referred to me by Charle Congo, MD on 12/10/23 for eval of asymptomatic gallstones w/elevated transaminases.    Interval History  Patient presents for evaluation of elevated liver enzymes and gallstones, accompanied by his wife.Patient denies GERD or dysphagia. Patient denies nausea, vomiting, or weight loss. Good appetite.  Patient denies altered bowel habits, abdominal pain, or rectal bleeding.  Patient drinks 1-2 times a week, he will drink 3-4 mixed drinks.   Nonsmoker. No IV drug use.   No new herbal supplements. No new medications  Patients last colonoscopy 2016 with Dr. Howard Macho, full report below  No significant family history of GI issues or CA.  Wt Readings from Last 3 Encounters:  01/07/24 202 lb 4 oz (91.7 kg)  05/16/23 197 lb (89.4 kg)  02/18/23 195 lb (88.5 kg)    Past Medical History:  Diagnosis Date   Arthritis    Colon polyps    Diabetes mellitus (HCC)    Hypertension    Prostate cancer (HCC) 2003    Past Surgical History:  Procedure Laterality Date   CORONARY STENT INTERVENTION N/A 08/12/2018   Procedure: CORONARY STENT INTERVENTION;  Surgeon: Cody Das, MD;  Location: [facility]VASIVE [facility];  Service: Cardiovascular;  Laterality: N/A;   LEFT HEART CATH AND CORONARY ANGIOGRAPHY N/A 08/11/2018   Procedure: LEFT HEART CATH AND CORONARY ANGIOGRAPHY;  Surgeon: Cody Das, MD;  Location: [facility] INVASIVE CV [facility];  Service: Cardiovascular;  Laterality: N/A;   seed implants     TONSILLECTOMY  age 49   Current Outpatient Medications  Medication Sig Dispense Refill   aspirin  EC 81 MG tablet Take 81 mg by mouth daily. Swallow whole.     atorvastatin  (LIPITOR ) 80 MG tablet Take 1 tablet (80  mg total) by mouth daily at 6 PM. 90 tablet 2   carvedilol  (COREG ) 6.25 MG tablet Take 1 tablet (6.25 mg total) by mouth 2 (two) times daily with a meal. 60 tablet 2   empagliflozin  (JARDIANCE ) 10 MG TABS tablet Take 10 mg by mouth daily. 30 tablet 3   glipiZIDE (GLUCOTROL) 5 MG tablet Take 5 mg by mouth daily.     glucose blood (TRUETRACK TEST) test strip Use as instructed 100 each 2   lisinopril  (ZESTRIL ) 5 MG tablet TAKE 1 TABLET BY MOUTH DAILY 90 tablet 3   metFORMIN  (GLUCOPHAGE ) 1000 MG tablet Take 1,000 mg by mouth 2 (two) times daily with a meal.     nitroGLYCERIN  (NITROSTAT ) 0.4 MG SL tablet Place 1 tablet (0.4 mg total) under the tongue every 5 (five) minutes x 3 doses as needed for chest pain. (Patient not taking: Reported on 01/07/2024) 30 tablet 1   No current facility-administered medications for this visit.    Allergies as of 01/07/2024   (No Known Allergies)   Family History  Problem Relation Age of Onset   Hypertension Mother    Diabetes Mother    Kidney failure Sister    Hypertension Sister    Hypertension Sister    Diabetes Brother    Hypertension Brother    Kidney failure Brother    Diabetes Brother    Hypertension Brother    Hypothyroidism Daughter    Colon cancer Neg Hx     Review of  Systems:    Constitutional: No weight loss, fever, chills, weakness or fatigue HEENT: Eyes: No change in vision               Ears, Nose, Throat:  No change in hearing or congestion Skin: No rash or itching Cardiovascular: No chest pain, chest pressure or palpitations   Respiratory: No SOB or cough Gastrointestinal: See HPI and otherwise negative Genitourinary: No dysuria or change in urinary frequency Neurological: No headache, dizziness or syncope Musculoskeletal: No new muscle or joint pain Hematologic: No bleeding or bruising Psychiatric: No history of depression or anxiety    Physical Exam:  Vital signs: BP 106/60 (BP Location: Left Arm, Patient Position: Sitting,  Cuff Size: Normal)   Pulse 60   Ht 5\' 11"  (1.803 m) Comment: height measured without shoes  Wt 202 lb 4 oz (91.7 kg)   BMI 28.21 kg/m   Constitutional:   Pleasant male appears to be in NAD, Well developed, Well nourished, alert and cooperative Throat: Oral cavity and pharynx without inflammation, swelling or lesion.  Respiratory: Respirations even and unlabored. Lungs clear to auscultation bilaterally.   No wheezes, crackles, or rhonchi.  Cardiovascular: Normal S1, S2. Regular rate and rhythm. No peripheral edema, cyanosis or pallor.  Gastrointestinal:  Soft, nondistended, nontender. No rebound or guarding. Normal bowel sounds. No appreciable masses or hepatomegaly. Rectal:  Not performed.  Msk:  Symmetrical without gross deformities. Without edema, no deformity or joint abnormality.  Neurologic:  Alert and  oriented x4;  grossly normal neurologically.  Skin:   Dry and intact without significant lesions or rashes. Psychiatric: Oriented to person, place and time. Demonstrates good judgement and reason without abnormal affect or behaviors.  RELEVANT LABS AND IMAGING: CBC    Latest Ref Rng & Units 10/10/2022   12:00 AM 03/12/2022   12:00 AM 11/20/2021    4:02 PM  CBC  WBC 3.8 - 10.8 Thousand/uL 6.6  8.5  7.6   Hemoglobin 13.2 - 17.1 g/dL 16.1  09.6  04.5   Hematocrit 38.5 - 50.0 % 43.0  41.2  39.8   Platelets 140 - 400 Thousand/uL 164  192  191      CMP     Latest Ref Rng & Units 12/05/2022    9:11 AM 10/10/2022   12:00 AM 03/12/2022   12:00 AM  CMP  Glucose 70 - 99 mg/dL 409  811  914   BUN 8 - 27 mg/dL 14  12  23    Creatinine 0.76 - 1.27 mg/dL 7.82  9.56  2.13   Sodium 134 - 144 mmol/L 139  135  134   Potassium 3.5 - 5.2 mmol/L 4.6  4.2  4.4   Chloride 96 - 106 mmol/L 101  102  99   CO2 20 - 29 mmol/L 23  22  25    Calcium  8.6 - 10.2 mg/dL 9.8  8.9  9.0   Total Protein 6.1 - 8.1 g/dL  7.3    Total Bilirubin 0.2 - 1.2 mg/dL  0.9    AST 10 - 35 U/L  65    ALT 9 - 46 U/L  68        Lab Results  Component Value Date   TSH 1.33 10/10/2022  10/22/2023 labs show: hepatitis panel AV IgM nonreactive, HBV surface antigen nonreactive, HBV core IgM nonreactive.  HCV antibody nonreactive BUN 13, creatinine 1.05, AST 158, ALT 160, ALP 93, bilirubin 0.8, albumin 3.6 hemoglobin 12.8, WBC 8.3, platelets 187, folate 14.8,  TSH 1.15, vitamin B12 456 12/04/2023 ultrasound abdomen complete FINDINGS: Visualized aorta, inferior vena cava, and pancreas are unremarkable. Liver measures 16 cm. No liver lesions. Gallstones. Normal gallbladder wall thickness. Common bile duct normal at 6 mm. Right kidney measures 12.4 and left kidney measures 12.4 cm. No hydronephrosis. Spleen is normal in size at 8.6 cm  01/07/2023 echo-Normal LV systolic function with visual EF 60-65%.  02/21/2015 colonoscopy with Dr. Howard Macho , recall 02/2018 Endoscopic impression 5 sessile polyps ranging between 3-19mm in size were found in the descending colon and ascending colon, polypectomies were performed with cold snare Path: Surgical [P], ascending, descending, polyp(5) - TUBULAR ADENOMA (X7 FRAGMENTS). - NO HIGH GRADE DYSPLASIA OR MALIGNANCY. 01/10/2007 colonoscopy with Dr. Howard Macho Impression 3 mm sessile polyp in transverse colon 4 mm sessile polyp in descending colon 4 mm sessile polyp in ascending colon 4 mm sessile polyp in cecum Otherwise normal exam  Assessment: Encounter Diagnoses  Name Primary?   Elevated LFTs Yes   History of colonic polyps    Gallstones      83 year old male patient with asymptomatic cholelithiasis and elevated liver enzymes. Nl TB/alk phos.  Elevated AST 158, ALT 160, ALP 93.  Abdominal ultrasound shows gallstones with common bile duct at 6 mm.  No liver lesions.  No splenomegaly. Discussed case with Dr. Venice Gillis.  Will recheck CBC, CMP, lipase.  Will check serology ASMA, AMA, iron studies, A1 AT, ceruloplasmin.  Also will order CT abdomen pelvis, if ductal dilatation on CT would need MRCP.   And consider referral to surgery.     Patient also has history of tubular adenomas and overdue for colonoscopy.  Recommend patient schedule colonoscopy, patient declined.  Will consider and call the office if he decides to schedule. Plan: -Recheck CBC, CMP, lipase -Check ASMA, AMA, iron studies, A1AT, cerruloplamin  -order  CT Abd/pelvis with contrast -recommend colonoscopy due to history of tubular adenomas , declined today  Thank you for the courtesy of this consult. Please call me with any questions or concerns.   Renay Crammer, FNP-C [facility] 01/07/2024, 3:12 PM  Cc: Charle Congo, MD

## 2024-01-07 NOTE — Patient Instructions (Signed)
 You have been scheduled for a CT scan of the abdomen and pelvis at [facility], 1st floor Radiology. You are scheduled on 01/15/24 at 3:30pm. You should arrive 15 minutes prior to your appointment time for registration.    You may take any medications as prescribed with a small amount of water, if necessary. If you take any of the following medications: METFORMIN , GLUCOPHAGE , GLUCOVANCE, AVANDAMET, RIOMET , FORTAMET , ACTOPLUS MET, JANUMET, GLUMETZA  or METAGLIP, you MAY be asked to HOLD this medication 48 hours AFTER the exam.   If you have any questions regarding your exam or if you need to reschedule, you may call [facility] at [phone] between the hours of 8:00 am and 5:00 pm, Monday-Friday.    Your provider has requested that you go to the basement level for lab work before leaving today. Press "B" on the elevator. The lab is located at the first door on the left as you exit the elevator.  _______________________________________________________  If your blood pressure at your visit was 140/90 or greater, please contact your primary care physician to follow up on this.  _______________________________________________________  If you are age 64 or older, your body mass index should be between 23-30. Your Body mass index is 28.21 kg/m. If this is out of the aforementioned range listed, please consider follow up with your Primary Care Provider.  If you are age 73 or younger, your body mass index should be between 19-25. Your Body mass index is 28.21 kg/m. If this is out of the aformentioned range listed, please consider follow up with your Primary Care Provider.   ________________________________________________________  The [facility] providers would like to encourage you to use MYCHART to communicate with providers for non-urgent requests or questions.  Due to long hold times on the telephone, sending your provider a message by [facility] may be a faster and more efficient  way to get a response.  Please allow 48 business hours for a response.  Please remember that this is for non-urgent requests.  _______________________________________________________ Thank you for trusting me with your gastrointestinal care!   Dyanna Glasgow, NP

## 2024-01-11 LAB — ANTI-SMOOTH MUSCLE ANTIBODY, IGG: Actin (Smooth Muscle) Antibody (IGG): 44 U — ABNORMAL HIGH (ref <20)

## 2024-01-11 LAB — MITOCHONDRIAL ANTIBODIES: Mitochondrial M2 Ab, IgG: <=20 U (ref <=20.0)

## 2024-01-11 LAB — ALPHA-1-ANTITRYPSIN: A-1 Antitrypsin, Ser: 152 mg/dL (ref 83–199)

## 2024-01-11 LAB — CERULOPLASMIN: Ceruloplasmin: 21 mg/dL (ref 14–30)

## 2024-01-12 NOTE — Progress Notes (Signed)
 Agree with assessment/plan.  Edman Circle, MD [facility] [phone]

## 2024-01-15 ENCOUNTER — Ambulatory Visit (HOSPITAL_COMMUNITY)
Admission: RE | Admit: 2024-01-15 | Discharge: 2024-01-15 | Disposition: A | Discharge status: Home / Self Care | Source: Ambulatory Visit | Attending: Gastroenterology | Admitting: Gastroenterology

## 2024-01-15 DIAGNOSIS — Z860100 Personal history of colon polyps, unspecified: Secondary | ICD-10-CM | POA: Diagnosis present

## 2024-01-15 DIAGNOSIS — K8020 Calculus of gallbladder without cholecystitis without obstruction: Secondary | ICD-10-CM | POA: Diagnosis present

## 2024-01-15 DIAGNOSIS — R7989 Other specified abnormal findings of blood chemistry: Secondary | ICD-10-CM | POA: Diagnosis present

## 2024-01-15 MED ORDER — IOHEXOL 300 MG/ML  SOLN
100.0000 mL | Freq: Once | INTRAMUSCULAR | Status: AC | PRN
  Administered 2024-01-15: 100 mL via INTRAVENOUS

## 2024-01-17 ENCOUNTER — Telehealth: Payer: Self-pay | Admitting: Gastroenterology

## 2024-01-17 NOTE — Telephone Encounter (Signed)
 Called patient, he was out playing golf. Spoke with his wife who was at his appointment. Let her know we needed to discuss his lab results and imaging. Will try to call this evening or weekend. She stated that would be fine.  Dasiah Hooley, NP

## 2024-01-23 ENCOUNTER — Telehealth: Payer: Self-pay | Admitting: Gastroenterology

## 2024-01-23 NOTE — Telephone Encounter (Signed)
 Spoke with patient and his wife about lab work and ct scan findings. Will discuss case with dr Venice Gillis for additional labwork and possible liver biopsy. Pt states he does not think he will pursue liver biopsy. Advised he stop drinking alcohol as well. Pt verbalizes understanding.

## 2024-01-28 ENCOUNTER — Telehealth: Payer: Self-pay | Admitting: Gastroenterology

## 2024-01-28 ENCOUNTER — Other Ambulatory Visit: Payer: Self-pay

## 2024-01-28 ENCOUNTER — Ambulatory Visit: Payer: Self-pay

## 2024-01-28 DIAGNOSIS — R7989 Other specified abnormal findings of blood chemistry: Secondary | ICD-10-CM

## 2024-01-28 DIAGNOSIS — Z860100 Personal history of colon polyps, unspecified: Secondary | ICD-10-CM

## 2024-01-28 DIAGNOSIS — K8020 Calculus of gallbladder without cholecystitis without obstruction: Secondary | ICD-10-CM

## 2024-01-28 NOTE — Telephone Encounter (Signed)
 Called patient to discuss Dr. Hobert Lull recommendations with patient and wife;  Although after positive ASMA, liver biopsy is usually the next step.  However, due to advanced age, patient refusing biopsy and relatively normal or mildly elevated liver function tests, please hold off on liver biopsy at this time  Agreed to check AFP, hepatitis panel, INR/PT  Check MELD score.  Avoid alcohol, hepatotoxic medications.  Low-salt diet  FU in your clinic in 6 to 8 weeks.  Does not require EGD for EV screening at this time  If any change in mental status, start lactulose/rifaximin.   Patient and wife verbalize understanding.

## 2024-02-04 ENCOUNTER — Other Ambulatory Visit (INDEPENDENT_AMBULATORY_CARE_PROVIDER_SITE_OTHER)

## 2024-02-04 DIAGNOSIS — Z860100 Personal history of colon polyps, unspecified: Secondary | ICD-10-CM

## 2024-02-04 DIAGNOSIS — R7989 Other specified abnormal findings of blood chemistry: Secondary | ICD-10-CM | POA: Diagnosis not present

## 2024-02-04 DIAGNOSIS — K8020 Calculus of gallbladder without cholecystitis without obstruction: Secondary | ICD-10-CM | POA: Diagnosis not present

## 2024-02-04 LAB — PROTIME-INR
INR: 1.1 ratio — ABNORMAL HIGH (ref 0.8–1.0)
Prothrombin Time: 12.1 s (ref 9.6–13.1)

## 2024-02-06 LAB — AFP TUMOR MARKER: AFP-Tumor Marker: 3.7 ng/mL (ref <6.1)

## 2024-02-06 LAB — HEPATITIS PANEL, ACUTE
Hep A IgM: NONREACTIVE
Hep B C IgM: NONREACTIVE
Hepatitis B Surface Ag: NONREACTIVE
Hepatitis C Ab: NONREACTIVE

## 2024-02-11 ENCOUNTER — Ambulatory Visit: Payer: Self-pay | Admitting: Gastroenterology

## 2024-02-12 NOTE — Telephone Encounter (Signed)
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

## 2024-03-04 ENCOUNTER — Other Ambulatory Visit: Payer: Self-pay | Admitting: Cardiology

## 2024-03-04 DIAGNOSIS — I25118 Atherosclerotic heart disease of native coronary artery with other forms of angina pectoris: Secondary | ICD-10-CM

## 2024-03-24 ENCOUNTER — Encounter: Payer: Self-pay | Admitting: Gastroenterology

## 2024-03-24 ENCOUNTER — Ambulatory Visit: Admitting: Gastroenterology

## 2024-03-24 VITALS — BP 122/64 | HR 66 | Ht 71.0 in | Wt 196.0 lb

## 2024-03-24 DIAGNOSIS — K8020 Calculus of gallbladder without cholecystitis without obstruction: Secondary | ICD-10-CM

## 2024-03-24 DIAGNOSIS — R768 Other specified abnormal immunological findings in serum: Secondary | ICD-10-CM | POA: Diagnosis not present

## 2024-03-24 DIAGNOSIS — Z860100 Personal history of colon polyps, unspecified: Secondary | ICD-10-CM

## 2024-03-24 DIAGNOSIS — K7460 Unspecified cirrhosis of liver: Secondary | ICD-10-CM | POA: Diagnosis not present

## 2024-03-24 DIAGNOSIS — Z7984 Long term (current) use of oral hypoglycemic drugs: Secondary | ICD-10-CM

## 2024-03-24 DIAGNOSIS — E119 Type 2 diabetes mellitus without complications: Secondary | ICD-10-CM

## 2024-03-24 DIAGNOSIS — K760 Fatty (change of) liver, not elsewhere classified: Secondary | ICD-10-CM

## 2024-03-24 NOTE — Patient Instructions (Addendum)
 _______________________________________________________  If your blood pressure at your visit was 140/90 or greater, please contact your primary care physician to follow up on this.  _______________________________________________________  If you are age 85 or older, your body mass index should be between 23-30. Your Body mass index is 27.34 kg/m. If this is out of the aforementioned range listed, please consider follow up with your Primary Care Provider.  If you are age 63 or younger, your body mass index should be between 19-25. Your Body mass index is 27.34 kg/m. If this is out of the aformentioned range listed, please consider follow up with your Primary Care Provider.   ________________________________________________________  The [facility] providers would like to encourage you to use MYCHART to communicate with providers for non-urgent requests or questions.  Due to long hold times on the telephone, sending your provider a message by [facility] may be a faster and more efficient way to get a response.  Please allow 48 business hours for a response.  Please remember that this is for non-urgent requests.  _______________________________________________________   Please get your Hep B surface antibody titer and hep A total antibodies done by primary care doctor to see if you need vaccination series from Hep A and B  Please follow up in 12 months. Give us  a call at [phone] to schedule an appointment.  Maintain weight   Thank you,  Dr. Lynnie Bring

## 2024-03-24 NOTE — Progress Notes (Signed)
 Chief Complaint: FU  Referring Provider:  Shelda Atlas, MD      ASSESSMENT AND PLAN;   #1.  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 Although ASMA is elevated, I do not believe he has autoimmune hepatitis.  #2.  Asymptomatic cholelithiasis  Plan: -Best possible control for DM -Check HBsAb titer and HAV total Ab.  If neg, would recommend vaccination for A and B. -Maintian wt -Low-salt diet. -Avoid hepatotoxic meds/ETOH. -FU in 1 yr. No indication for screening EGD for EV as yet. -Can consider GLP-1 for control for DM. Pt has appt with PCP in AM. He and his wife would discuss    HPI:    Connor Cross is a 83 y.o. male  With history of HTN, DM2, arthritis, HTN, history of colonic polyps 2016 (refused follow-up colonoscopies) Accompanied by his wife.  For abnormal CT Abdo/pelvis showing liver cirrhosis without portal hypertension, cholelithiasis.  No GI complaints  History of Present Illness follow-up visit.  He has cirrhosis of the liver, not attributed to alcohol consumption. No recent weight changes, epistaxis, gum bleeding, heartburn, nausea, vomiting, diarrhea, constipation, or confusion. He experiences occasional dysphagia, particularly with chicken, if not chewed properly, persisting for six months to a year without progression.  He has a history of gallstones and fatty liver, with gallstones present for years without causing symptoms. No jaundice or scleral icterus.  He is diabetic, currently managed with Jardiance  and metformin . His diabetes is reported to be under control, with a hemoglobin A1c of 11.5 a year ago before starting Jardiance .  He engages in regular physical activity, playing golf and traveling frequently, including trips to [place] . He experiences occasional pruritus due to sunburn from golfing. He consumes alcohol socially, about two shots once a week or less, and denies daily consumption.  No H/O  itching, skin lesions, easy bruisability, intake of OTC meds including diet pills, herbal medications, anabolic steroids or Tylenol . There is no H/O blood transfusions, IVDA or FH of liver disease. No jaundice, dark urine or pale stools. No alcohol abuse.      Latest Ref Rng & Units 01/07/2024    3:30 PM 10/10/2022   12:00 AM 11/20/2021    4:02 PM  Hepatic Function  Total Protein 6.0 - 8.3 g/dL 7.8  7.3  7.3   Albumin 3.5 - 5.2 g/dL 3.7     AST 0 - 37 U/L 51  65  41   ALT 0 - 53 U/L 46  68  35   Alk Phosphatase 39 - 117 U/L 107     Total Bilirubin 0.2 - 1.2 mg/dL 0.7  0.9  0.7    Liver workup: CT abdomen/pelvis with contrast 01/16/2024: Cirrhosis, cholelithiasis.  No portal hypertension.  No ascites. ASMA 44 AMA: Nl  Negative acute hepatitis panel, alpha-1 antitrypsin, ceruloplasmin, normal iron studies with saturation 34%. AFP 02/04/2024: 3.7, Alb 3.7 INR: 1.1 Refused liver biopsy.   Last colonoscopy 2016 with small colonic polyps.  Recommend to repeat in 3 years.  He declines.  Past Medical History:  Diagnosis Date   Arthritis    Colon polyps    Diabetes mellitus (HCC)    Hypertension    Prostate cancer (HCC) 2003    Past Surgical History:  Procedure Laterality Date   CORONARY STENT INTERVENTION N/A 08/12/2018   Procedure: CORONARY STENT INTERVENTION;  Surgeon: Elmira Newman PARAS, MD;  Location: [facility];  Service: Cardiovascular;  Laterality: N/A;  LEFT HEART CATH AND CORONARY ANGIOGRAPHY N/A 08/11/2018   Procedure: LEFT HEART CATH AND CORONARY ANGIOGRAPHY;  Surgeon: Elmira Newman PARAS, MD;  Location: [facility];  Service: Cardiovascular;  Laterality: N/A;   seed implants     TONSILLECTOMY  age 22    Family History  Problem Relation Age of Onset   Hypertension Mother    Diabetes Mother    Kidney failure Sister    Hypertension Sister    Hypertension Sister    Diabetes Brother    Hypertension Brother    Kidney failure Brother    Diabetes  Brother    Hypertension Brother    Hypothyroidism Daughter    Colon cancer Neg Hx     Social History   Tobacco Use   Smoking status: Former    Current packs/day: 0.00    Types: Cigarettes    Quit date: 09/17/2004    Years since quitting: 19.5   Smokeless tobacco: Never  Vaping Use   Vaping status: Never Used  Substance Use Topics   Alcohol use: Yes    Alcohol/week: 3.0 standard drinks of alcohol    Types: 3 Shots of liquor per week    Comment: weekends   Drug use: No    Current Outpatient Medications  Medication Sig Dispense Refill   aspirin  EC 81 MG tablet Take 81 mg by mouth daily. Swallow whole.     atorvastatin  (LIPITOR ) 80 MG tablet Take 1 tablet (80 mg total) by mouth daily at 6 PM. 90 tablet 2   carvedilol  (COREG ) 12.5 MG tablet TAKE 1 TABLET BY MOUTH TWICE A DAY WITH A MEAL 180 tablet 0   carvedilol  (COREG ) 6.25 MG tablet Take 1 tablet (6.25 mg total) by mouth 2 (two) times daily with a meal. 60 tablet 2   empagliflozin  (JARDIANCE ) 10 MG TABS tablet Take 10 mg by mouth daily. 30 tablet 3   glipiZIDE (GLUCOTROL) 5 MG tablet Take 5 mg by mouth daily.     glucose blood (TRUETRACK TEST) test strip Use as instructed 100 each 2   lisinopril  (ZESTRIL ) 5 MG tablet TAKE 1 TABLET BY MOUTH DAILY 90 tablet 3   metFORMIN  (GLUCOPHAGE ) 1000 MG tablet Take 1,000 mg by mouth 2 (two) times daily with a meal.     nitroGLYCERIN  (NITROSTAT ) 0.4 MG SL tablet Place 1 tablet (0.4 mg total) under the tongue every 5 (five) minutes x 3 doses as needed for chest pain. 30 tablet 1   No current facility-administered medications for this visit.    No Known Allergies  Review of Systems:  neg     Physical Exam:    BP 122/64   Pulse 66   Ht 5' 11 (1.803 m)   Wt 196 lb (88.9 kg)   BMI 27.34 kg/m  Wt Readings from Last 3 Encounters:  03/24/24 196 lb (88.9 kg)  01/07/24 202 lb 4 oz (91.7 kg)  05/16/23 197 lb (89.4 kg)   Constitutional:  Well-developed, in no acute  distress. Psychiatric: Normal mood and affect. Behavior is normal. HEENT: Pupils normal.  Conjunctivae are normal. No scleral icterus. Neck supple.  Cardiovascular: Normal rate, regular rhythm. No edema Pulmonary/chest: Effort normal and breath sounds normal. No wheezing, rales or rhonchi. Abdominal: Soft, nondistended. Nontender. Bowel sounds active throughout. There are no masses palpable.  Liver palpated 4 cm below the costal margin, firm in consistency. Rectal: Deferred Neurological: Alert and oriented to person place and time. Skin: Skin is warm and dry. No rashes  noted.  Data Reviewed: I have personally reviewed following labs and imaging studies  CBC:    Latest Ref Rng & Units 01/07/2024    3:30 PM 10/10/2022   12:00 AM 03/12/2022   12:00 AM  CBC  WBC 4.0 - 10.5 K/uL 8.3  6.6  8.5   Hemoglobin 13.0 - 17.0 g/dL 85.6  85.4  85.8   Hematocrit 39.0 - 52.0 % 42.7  43.0  41.2   Platelets 150.0 - 400.0 K/uL 156.0  164  192     CMP:    Latest Ref Rng & Units 01/07/2024    3:30 PM 12/05/2022    9:11 AM 10/10/2022   12:00 AM  CMP  Glucose 70 - 99 mg/dL 884  840  591   BUN 6 - 23 mg/dL 15  14  12    Creatinine 0.40 - 1.50 mg/dL 8.95  8.98  9.14   Sodium 135 - 145 mEq/L 139  139  135   Potassium 3.5 - 5.1 mEq/L 4.3  4.6  4.2   Chloride 96 - 112 mEq/L 105  101  102   CO2 19 - 32 mEq/L 28  23  22    Calcium  8.4 - 10.5 mg/dL 9.4  9.8  8.9   Total Protein 6.0 - 8.3 g/dL 7.8   7.3   Total Bilirubin 0.2 - 1.2 mg/dL 0.7   0.9   Alkaline Phos 39 - 117 U/L 107     AST 0 - 37 U/L 51   65   ALT 0 - 53 U/L 46   68        Anselm Bring, MD 03/24/2024, 2:21 PM  Cc: Shelda Atlas, MD

## 2024-06-14 ENCOUNTER — Other Ambulatory Visit: Payer: Self-pay | Admitting: Cardiology

## 2024-06-14 DIAGNOSIS — I2510 Atherosclerotic heart disease of native coronary artery without angina pectoris: Secondary | ICD-10-CM
